# Patient Record
Sex: Female | Born: 1975 | Race: White | Hispanic: No | Marital: Married | Smoking: Current some day smoker
Health system: Southern US, Community
[De-identification: ages and names within clinical notes are randomized; demographics above are authoritative.]

## PROBLEM LIST (undated history)

## (undated) DIAGNOSIS — Z933 Colostomy status: Secondary | ICD-10-CM

## (undated) DIAGNOSIS — F419 Anxiety disorder, unspecified: Secondary | ICD-10-CM

## (undated) DIAGNOSIS — F32A Depression, unspecified: Secondary | ICD-10-CM

## (undated) DIAGNOSIS — K5792 Diverticulitis of intestine, part unspecified, without perforation or abscess without bleeding: Secondary | ICD-10-CM

## (undated) DIAGNOSIS — F329 Major depressive disorder, single episode, unspecified: Secondary | ICD-10-CM

## (undated) DIAGNOSIS — M549 Dorsalgia, unspecified: Secondary | ICD-10-CM

## (undated) DIAGNOSIS — I10 Essential (primary) hypertension: Secondary | ICD-10-CM

## (undated) HISTORY — DX: Depression, unspecified: F32.A

## (undated) HISTORY — DX: Dorsalgia, unspecified: M54.9

## (undated) HISTORY — PX: BOWEL RESECTION: SHX1257

## (undated) HISTORY — DX: Anxiety disorder, unspecified: F41.9

## (undated) HISTORY — DX: Essential (primary) hypertension: I10

## (undated) HISTORY — DX: Major depressive disorder, single episode, unspecified: F32.9

## (undated) HISTORY — PX: OTHER SURGICAL HISTORY: SHX169

---

## 2000-10-15 ENCOUNTER — Other Ambulatory Visit: Admission: RE | Admit: 2000-10-15 | Discharge: 2000-10-15 | Payer: Self-pay | Admitting: *Deleted

## 2001-10-27 ENCOUNTER — Other Ambulatory Visit: Admission: RE | Admit: 2001-10-27 | Discharge: 2001-10-27 | Payer: Self-pay | Admitting: *Deleted

## 2003-12-06 ENCOUNTER — Ambulatory Visit (HOSPITAL_COMMUNITY): Admission: RE | Admit: 2003-12-06 | Discharge: 2003-12-06 | Payer: Self-pay | Admitting: Obstetrics & Gynecology

## 2006-01-06 ENCOUNTER — Ambulatory Visit (HOSPITAL_COMMUNITY): Admission: RE | Admit: 2006-01-06 | Discharge: 2006-01-06 | Payer: Self-pay | Admitting: Obstetrics and Gynecology

## 2007-05-20 ENCOUNTER — Other Ambulatory Visit: Admission: RE | Admit: 2007-05-20 | Discharge: 2007-05-20 | Payer: Self-pay | Admitting: Obstetrics and Gynecology

## 2008-06-01 ENCOUNTER — Other Ambulatory Visit: Admission: RE | Admit: 2008-06-01 | Discharge: 2008-06-01 | Payer: Self-pay | Admitting: Obstetrics & Gynecology

## 2008-07-17 ENCOUNTER — Inpatient Hospital Stay (HOSPITAL_COMMUNITY): Admission: AD | Admit: 2008-07-17 | Discharge: 2008-07-17 | Payer: Self-pay | Admitting: Obstetrics & Gynecology

## 2009-11-14 ENCOUNTER — Ambulatory Visit: Payer: Self-pay | Admitting: Gastroenterology

## 2009-11-14 DIAGNOSIS — R109 Unspecified abdominal pain: Secondary | ICD-10-CM | POA: Insufficient documentation

## 2009-11-14 DIAGNOSIS — R197 Diarrhea, unspecified: Secondary | ICD-10-CM | POA: Insufficient documentation

## 2009-11-15 ENCOUNTER — Telehealth: Payer: Self-pay | Admitting: Gastroenterology

## 2009-11-19 LAB — CONVERTED CEMR LAB
ALT: 11 units/L (ref 0–35)
AST: 13 units/L (ref 0–37)
Albumin: 4.3 g/dL (ref 3.5–5.2)
Alkaline Phosphatase: 51 units/L (ref 39–117)
BUN: 14 mg/dL (ref 6–23)
Basophils Absolute: 0.1 10*3/uL (ref 0.0–0.1)
Basophils Relative: 1 % (ref 0–1)
Beta hcg, urine, semiquantitative: NEGATIVE
CA 125: 14.8 units/mL (ref 0.0–30.2)
CO2: 24 meq/L (ref 19–32)
Calcium: 9.4 mg/dL (ref 8.4–10.5)
Chloride: 107 meq/L (ref 96–112)
Creatinine, Ser: 0.85 mg/dL (ref 0.40–1.20)
Eosinophils Absolute: 0.1 10*3/uL (ref 0.0–0.7)
Eosinophils Relative: 1 % (ref 0–5)
Glucose, Bld: 89 mg/dL (ref 70–99)
HCT: 43.2 % (ref 36.0–46.0)
Hemoglobin: 14.5 g/dL (ref 12.0–15.0)
Lymphocytes Relative: 25 % (ref 12–46)
Lymphs Abs: 2.2 10*3/uL (ref 0.7–4.0)
MCHC: 33.6 g/dL (ref 30.0–36.0)
MCV: 91.3 fL (ref 78.0–100.0)
Monocytes Absolute: 0.7 10*3/uL (ref 0.1–1.0)
Monocytes Relative: 8 % (ref 3–12)
Neutro Abs: 5.7 10*3/uL (ref 1.7–7.7)
Neutrophils Relative %: 66 % (ref 43–77)
Platelets: 290 10*3/uL (ref 150–400)
Potassium: 4.4 meq/L (ref 3.5–5.3)
RBC: 4.73 M/uL (ref 3.87–5.11)
RDW: 13 % (ref 11.5–15.5)
Sodium: 138 meq/L (ref 135–145)
TSH: 1.388 microintl units/mL (ref 0.350–4.500)
Tissue Transglutaminase Ab, IgA: 18.8 units (ref <20)
Total Bilirubin: 0.5 mg/dL (ref 0.3–1.2)
Total Protein: 7.3 g/dL (ref 6.0–8.3)
WBC: 8.8 10*3/uL (ref 4.0–10.5)

## 2009-12-19 ENCOUNTER — Encounter (INDEPENDENT_AMBULATORY_CARE_PROVIDER_SITE_OTHER): Payer: Self-pay | Admitting: *Deleted

## 2010-01-03 ENCOUNTER — Encounter (INDEPENDENT_AMBULATORY_CARE_PROVIDER_SITE_OTHER): Payer: Self-pay | Admitting: *Deleted

## 2010-03-24 ENCOUNTER — Encounter: Payer: Self-pay | Admitting: Gastroenterology

## 2010-04-02 NOTE — Letter (Signed)
Summary: CT SCAN ORDER  CT SCAN ORDER   Imported By: Ave Filter 11/14/2009 11:54:44  _____________________________________________________________________  External Attachment:    Type:   Image     Comment:   External Document

## 2010-04-02 NOTE — Assessment & Plan Note (Signed)
Summary: ABD PAIN, DIARRHEA   Visit Type:  Initial Consult Referring Provider:  Chilton Si, NP-c Primary Care Provider:  Juanetta Gosling, M.D.  Chief Complaint:  abdominal pain/diarrhea.  History of Present Illness: Has pain and diarrhea. Not always related. Sx for 6 mos: same. AChy from ribs to pelvis. Comes and goes. Sx lasts hours, FRI all night and couldn't sleep. Happens 1-2x/month. No precipitating factors. GB in. Not related to foods. Eats a fast lunch when working. Stress: family. No blood in stool, nausea, problems swallowing, or constipation or melena. Rare heartburn/indigestion associate with eating the wrong thing. Rare vomiting with diarrhea. Worse with jarring abdomen. No ASA, BC, Goody's, or ALeve.  800 mg Iburofen two times a day ever month for two doses for menstrual cramps. No travel, Abx, and has well water. No sick contacts. Bms: 2, nl-fmd. Never been diagnosed with IBS. Gained 25 lbs since got married 1.5 yr ago.  Preventive Screening-Counseling & Management  Alcohol-Tobacco     Smoking Status: current      Drug Use:  no.    Current Medications (verified): 1)  No Meds  Allergies (verified): No Known Drug Allergies  Past History:  Past Medical History: None  Past Surgical History: None  Family History: No FH of Colon Cancer or polyps or celiac sprue brother has complicated diverticulitis/partial colectomy. Mother has IBS.  No Family History of Ovarian Cancer: No Family History of Pancreatic Cancer: No Family History of Stomach Cancer: No Family History of Uterine Cancer:  Social History: Occupation: Charity fundraiser, L&D Patient currently smokes. 1 pk/day Married, no kids Alcohol Use - yes: 3x/week Illicit Drug Use - no Smoking Status:  current Drug Use:  no  Review of Systems       No CP, hemoptysis, or SOB. Smoker's cough  Abd u/s 2007: GB WALL 2.8 MM thickened  May 2010: nl hfp, cbc, neg urine hcg  PER HPI OTHERWISE ALL SYSTEMS NEGATIVE.  Vital  Signs:  Patient profile:   35 year old female Height:      64.5 inches Weight:      173 pounds BMI:     29.34 Temp:     99.5 degrees F oral Pulse rate:   72 / minute BP sitting:   104 / 78  (left arm) Cuff size:   regular  Vitals Entered By: Cloria Spring LPN (November 14, 2009 10:54 AM)  Physical Exam  General:  Well developed, well nourished, no acute distress. Head:  Normocephalic and atraumatic. Eyes:  PERRL, no icterus. Mouth:  No deformity or lesions. Neck:  Supple; no masses or thyromegaly, nt THYROID. Lungs:  Clear throughout to auscultation. Heart:  Regular rate and rhythm; no murmurs. Abdomen:  Soft, nontender and nondistended. No masses, or hernias noted. Normal bowel sounds. Extremities:  No edema or deformities noted. Neurologic:  Alert and  oriented x4;  grossly normal neurologically. Skin:  NO PRETIBIAL LESIONS.  Impression & Recommendations:  Problem # 1:  DIARRHEA (ICD-787.91) Differential diagnosis includes IBS-d, lactose intolerance, celiac sprue, inflammatory bowel disease, CDIFF colitis,  bile-salt induced diarrhea due to biliary dyskinesia, or microscopic colitis. Submit stool studies WHEN YOU HAVE DIARRHEA. Low dairy diet for 2 weeks and see if pain or diarrhea still occurs. Follow a LOW FAT DIET BEGINNING IN 2 WEEKS. Use Levsin as needed for abd cramps and pain. Levsin may cause dry EYES/MOUTH, DROWSINESS, BLURRY VISION OR URINARY RETENTION. Check labs today.FOLLOW UP IN 2 MOS. If Sx not improved or she has evidnece for anemia  then she will need a TCS.    Orders: T-CBC w/Diff [id number]) T-CA [phone]) T-Stool Giardia / Crypto- EIA ([id number]) T-Fecal WBC ([id number]) T-Culture, C-Diff Toxin A/B ([id number]) T-Culture, C-Diff Toxin A/B ([id number]) T-Culture, C-Diff Toxin A/B ([id number])  Problem # 2:  ABDOMINAL PAIN (ICD-789.00) Abnl GB wall in 2007 and no subsequent imaging. Differential diagnosis includes IBS-d, lactose intolerance,  celiac sprue, inflammatory bowel disease, CDIFF colitis,  bile-salt induced diarrhea due to biliary dyskinesia, pregnancy, and less likely ovarian CA or GB cancer. Will order a CT SCAN of ABD/PELVIS. Labs to check for ovarian CA AND PREGNANCY    CC: PCP  Orders: T-CBC w/Diff ([id number]) T-CA 125 [phone]) T-Stool Giardia / Crypto- EIA (10932) T-Fecal WBC ([id number]) T-Culture, C-Diff Toxin A/B ([id number]) T-Culture, C-Diff Toxin A/B ([id number]) T-Culture, C-Diff Toxin A/B ([id number])  Other Orders: T-TSH [id number]) T-Tissue Transglutamase Ab IgA ([id number]) T-Comprehensive Metabolic Panel ([id number])  Patient Instructions: 1)  Will order a CT SCAN of ABD/PELVIS. 2)  Labs to check for ovarian CA, thyroid problems or allergy to wheat. 3)  Submit stool studies WHEN YOU HAVE DIARRHEA. 4)  Low dairy diet for 2 weeks and see if pain or diarrhea still occurs. 5)  Follow a LOW FAT DIET BEGINNING IN 2 WEEKS. 6)  Use Levsin as needed for abd cramps and pain. 7)  Levsin may cause dry EYES/MOUTH, DROWSINESS, BLURRY VISION OR URINARY RETENTION. 8)  FOLLOW UP IN 2 MOS.  9)  The medication list was reviewed and reconciled.  All changed / newly prescribed medications were explained.  A complete medication list was provided to the patient / caregiver. Prescriptions: LEVSIN 0.125 MG TABS (HYOSCYAMINE SULFATE) 1-2 sl 30 minutes before meals or with onset of abd pain, may repeat every 4 hours. No more than 8/day  #40 x 5   Entered and Authorized by:   West Bali MD   Signed by:   West Bali MD on 11/14/2009   Method used:   Electronically to        [facility] (retail)       [address]       [place], [place]  [address]       Ph: [phone] or [phone]       Fax: [phone]   RxID:   [id number]   Appended Document: ABD PAIN, DIARRHEA 2 MONTH F/U OPV IS IN THE COMPUTER  Appended Document: Orders Update    Clinical  Lists Changes  Orders: Added new Service order of Consultation Level IV [id number]) - Signed

## 2010-04-02 NOTE — Miscellaneous (Signed)
Summary: Orders Update  Clinical Lists Changes  Problems: Added new problem of DIARRHEA (ICD-787.91) Added new problem of ABDOMINAL PAIN (ICD-789.00) Orders: Added new Test order of T-Pregnancy Test, Urine, Qual (24235) - Signed

## 2010-04-02 NOTE — Progress Notes (Signed)
Summary: NL TSH, CMP, CBC, CA125, HCG  Please call pt. She is not pregnant and her marker for ovarian CA is negative. Her CBC, CMP, and TSH are normal. I am still waiting on the results to determine if she has celiac sprue. West Bali MD  November 15, 2009 10:12 AM]  FAX LABS TO PCP. West Bali MD  November 15, 2009 10:13 AM   Appended Document: NL TSH, CMP, CBC, CA125, HCG tried to call pt- NA  Appended Document: NL TSH, CMP, CBC, CA125, HCG Please call pt. She does not have an allergy to wheat. Her blood test was negative for celiac sprue.  Appended Document: NL TSH, CMP, CBC, CA125, HCG Pt was informed of all of the above

## 2010-04-02 NOTE — Letter (Signed)
Summary: Recall Office Visit  [facility]  [address]   [place], [place] [address]   Phone: [phone]  Fax: [phone]      January 03, 2010   QIARA MINETTI [address] [place], [place]  [address] 18-Dec-1975   Dear [name],   According to our records, it is time for you to schedule a follow-up office visit with [place].   At your convenience, please call [phone] to schedule an office visit. If you have any questions, concerns, or feel that this letter is in error, we would appreciate your call.   Sincerely,    Diana Eves  [facility] Ph: [phone]   Fax: [phone]

## 2010-04-02 NOTE — Letter (Signed)
Summary: Radiology Test Reminder  [facility]  [address]   [place], [place] [address]   Phone: [phone]  Fax: [phone]     December 19, 2009   Cheryl Ballard [address] [place], [place]  [address] 1975/10/07  Dear [name],  During your last appointment, your doctor requested you have a CT Scan.  Our records indicate you have not had this done.  Remember it is very important to follow your doctor's instructions.  Please have this done as soon as possible.  If you have questions regarding this appointment, please call our office and we can reschedule this for you.  It is important that patients and their doctor work together in the management and treatment of their health care.  If you have already had your test done, please disregard this letter.  Thank you,    Ave Filter  [facility] Ph: [phone]   Fax: [phone]

## 2010-04-03 HISTORY — PX: COLON SURGERY: SHX602

## 2010-04-06 ENCOUNTER — Encounter (HOSPITAL_COMMUNITY): Payer: Self-pay

## 2010-04-06 ENCOUNTER — Inpatient Hospital Stay (HOSPITAL_COMMUNITY)
Admission: EM | Admit: 2010-04-06 | Discharge: 2010-04-10 | DRG: 329 | Disposition: A | Discharge status: Home Health | Payer: 59 | Attending: General Surgery | Admitting: General Surgery

## 2010-04-06 ENCOUNTER — Other Ambulatory Visit: Payer: Self-pay | Admitting: General Surgery

## 2010-04-06 ENCOUNTER — Emergency Department (HOSPITAL_COMMUNITY): Admit: 2010-04-06 | Discharge: 2010-04-06 | Disposition: A | Discharge status: Home / Self Care | Payer: 59

## 2010-04-06 DIAGNOSIS — N83209 Unspecified ovarian cyst, unspecified side: Secondary | ICD-10-CM | POA: Diagnosis present

## 2010-04-06 DIAGNOSIS — N735 Female pelvic peritonitis, unspecified: Secondary | ICD-10-CM | POA: Diagnosis present

## 2010-04-06 DIAGNOSIS — K5732 Diverticulitis of large intestine without perforation or abscess without bleeding: Principal | ICD-10-CM | POA: Diagnosis present

## 2010-04-06 DIAGNOSIS — B951 Streptococcus, group B, as the cause of diseases classified elsewhere: Secondary | ICD-10-CM | POA: Diagnosis present

## 2010-04-06 LAB — CBC
HCT: 38.5 % (ref 36.0–46.0)
Hemoglobin: 13.4 g/dL (ref 12.0–15.0)
MCH: 31.2 pg (ref 26.0–34.0)
MCHC: 34.8 g/dL (ref 30.0–36.0)
MCV: 89.5 fL (ref 78.0–100.0)
Platelets: 184 10*3/uL (ref 150–400)
RBC: 4.3 MIL/uL (ref 3.87–5.11)
RDW: 13.5 % (ref 11.5–15.5)
WBC: 15.8 10*3/uL — ABNORMAL HIGH (ref 4.0–10.5)

## 2010-04-06 LAB — BASIC METABOLIC PANEL
BUN: 10 mg/dL (ref 6–23)
CO2: 24 mEq/L (ref 19–32)
Calcium: 8.8 mg/dL (ref 8.4–10.5)
Chloride: 103 mEq/L (ref 96–112)
Creatinine, Ser: 0.88 mg/dL (ref 0.4–1.2)
GFR calc Af Amer: >60 mL/min (ref >60)
GFR calc non Af Amer: >60 mL/min (ref >60)
Glucose, Bld: 124 mg/dL — ABNORMAL HIGH (ref 70–99)
Potassium: 3.4 mEq/L — ABNORMAL LOW (ref 3.5–5.1)
Sodium: 134 mEq/L — ABNORMAL LOW (ref 135–145)

## 2010-04-06 LAB — URINALYSIS, ROUTINE W REFLEX MICROSCOPIC
Bilirubin Urine: NEGATIVE
Hgb urine dipstick: NEGATIVE
Ketones, ur: NEGATIVE mg/dL
Nitrite: NEGATIVE
Protein, ur: NEGATIVE mg/dL
Specific Gravity, Urine: 1.025 (ref 1.005–1.030)
Urine Glucose, Fasting: NEGATIVE mg/dL
Urobilinogen, UA: 0.2 mg/dL (ref 0.0–1.0)
pH: 6 (ref 5.0–8.0)

## 2010-04-06 LAB — DIFFERENTIAL
Basophils Absolute: 0 10*3/uL (ref 0.0–0.1)
Basophils Relative: 0 % (ref 0–1)
Eosinophils Absolute: 0 10*3/uL (ref 0.0–0.7)
Eosinophils Relative: 0 % (ref 0–5)
Lymphocytes Relative: 10 % — ABNORMAL LOW (ref 12–46)
Lymphs Abs: 1.6 10*3/uL (ref 0.7–4.0)
Monocytes Absolute: 0.7 10*3/uL (ref 0.1–1.0)
Monocytes Relative: 4 % (ref 3–12)
Neutro Abs: 13.5 10*3/uL — ABNORMAL HIGH (ref 1.7–7.7)
Neutrophils Relative %: 85 % — ABNORMAL HIGH (ref 43–77)

## 2010-04-06 LAB — WET PREP, GENITAL
Trich, Wet Prep: NONE SEEN
Yeast Wet Prep HPF POC: NONE SEEN

## 2010-04-06 LAB — PREGNANCY, URINE: Preg Test, Ur: NEGATIVE

## 2010-04-06 MED ORDER — IOHEXOL 300 MG/ML  SOLN
100.0000 mL | Freq: Once | INTRAMUSCULAR | Status: AC | PRN
  Administered 2010-04-06: 100 mL via INTRAVENOUS

## 2010-04-07 LAB — BASIC METABOLIC PANEL
BUN: 6 mg/dL (ref 6–23)
CO2: 25 mEq/L (ref 19–32)
Calcium: 8.1 mg/dL — ABNORMAL LOW (ref 8.4–10.5)
Chloride: 105 mEq/L (ref 96–112)
Creatinine, Ser: 0.8 mg/dL (ref 0.4–1.2)
GFR calc Af Amer: >60 mL/min (ref >60)
GFR calc non Af Amer: >60 mL/min (ref >60)
Glucose, Bld: 122 mg/dL — ABNORMAL HIGH (ref 70–99)
Potassium: 3.4 mEq/L — ABNORMAL LOW (ref 3.5–5.1)
Sodium: 135 mEq/L (ref 135–145)

## 2010-04-07 LAB — MAGNESIUM: Magnesium: 1.8 mg/dL (ref 1.5–2.5)

## 2010-04-07 LAB — CBC
HCT: 31.6 % — ABNORMAL LOW (ref 36.0–46.0)
Hemoglobin: 11.1 g/dL — ABNORMAL LOW (ref 12.0–15.0)
MCH: 31.6 pg (ref 26.0–34.0)
MCHC: 35.1 g/dL (ref 30.0–36.0)
MCV: 90 fL (ref 78.0–100.0)
Platelets: 169 10*3/uL (ref 150–400)
RBC: 3.51 MIL/uL — ABNORMAL LOW (ref 3.87–5.11)
RDW: 13.7 % (ref 11.5–15.5)
WBC: 15.7 10*3/uL — ABNORMAL HIGH (ref 4.0–10.5)

## 2010-04-07 LAB — DIFFERENTIAL
Basophils Absolute: 0 10*3/uL (ref 0.0–0.1)
Basophils Relative: 0 % (ref 0–1)
Eosinophils Absolute: 0 10*3/uL (ref 0.0–0.7)
Eosinophils Relative: 0 % (ref 0–5)
Lymphocytes Relative: 10 % — ABNORMAL LOW (ref 12–46)
Lymphs Abs: 1.5 10*3/uL (ref 0.7–4.0)
Monocytes Absolute: 1.1 10*3/uL — ABNORMAL HIGH (ref 0.1–1.0)
Monocytes Relative: 7 % (ref 3–12)
Neutro Abs: 13 10*3/uL — ABNORMAL HIGH (ref 1.7–7.7)
Neutrophils Relative %: 83 % — ABNORMAL HIGH (ref 43–77)

## 2010-04-07 LAB — ALBUMIN: Albumin: 2.7 g/dL — ABNORMAL LOW (ref 3.5–5.2)

## 2010-04-07 LAB — PHOSPHORUS: Phosphorus: 2.2 mg/dL — ABNORMAL LOW (ref 2.3–4.6)

## 2010-04-08 LAB — DIFFERENTIAL
Basophils Absolute: 0 10*3/uL (ref 0.0–0.1)
Basophils Relative: 0 % (ref 0–1)
Eosinophils Absolute: 0.2 10*3/uL (ref 0.0–0.7)
Eosinophils Relative: 1 % (ref 0–5)
Lymphocytes Relative: 14 % (ref 12–46)
Lymphs Abs: 1.6 10*3/uL (ref 0.7–4.0)
Monocytes Absolute: 1.1 10*3/uL — ABNORMAL HIGH (ref 0.1–1.0)
Monocytes Relative: 9 % (ref 3–12)
Neutro Abs: 8.9 10*3/uL — ABNORMAL HIGH (ref 1.7–7.7)
Neutrophils Relative %: 76 % (ref 43–77)

## 2010-04-08 LAB — MAGNESIUM: Magnesium: 1.8 mg/dL (ref 1.5–2.5)

## 2010-04-08 LAB — CBC
HCT: 34.1 % — ABNORMAL LOW (ref 36.0–46.0)
Hemoglobin: 11.8 g/dL — ABNORMAL LOW (ref 12.0–15.0)
MCH: 31.1 pg (ref 26.0–34.0)
MCHC: 34.6 g/dL (ref 30.0–36.0)
MCV: 90 fL (ref 78.0–100.0)
Platelets: 197 10*3/uL (ref 150–400)
RBC: 3.79 MIL/uL — ABNORMAL LOW (ref 3.87–5.11)
RDW: 13.7 % (ref 11.5–15.5)
WBC: 11.7 10*3/uL — ABNORMAL HIGH (ref 4.0–10.5)

## 2010-04-08 LAB — BASIC METABOLIC PANEL
BUN: 7 mg/dL (ref 6–23)
CO2: 27 mEq/L (ref 19–32)
Calcium: 8.3 mg/dL — ABNORMAL LOW (ref 8.4–10.5)
Chloride: 107 mEq/L (ref 96–112)
Creatinine, Ser: 0.85 mg/dL (ref 0.4–1.2)
GFR calc Af Amer: >60 mL/min (ref >60)
GFR calc non Af Amer: >60 mL/min (ref >60)
Glucose, Bld: 105 mg/dL — ABNORMAL HIGH (ref 70–99)
Potassium: 3.7 mEq/L (ref 3.5–5.1)
Sodium: 141 mEq/L (ref 135–145)

## 2010-04-08 LAB — GC/CHLAMYDIA PROBE AMP, GENITAL
Chlamydia, DNA Probe: NEGATIVE
GC Probe Amp, Genital: NEGATIVE

## 2010-04-08 LAB — ALBUMIN: Albumin: 2.5 g/dL — ABNORMAL LOW (ref 3.5–5.2)

## 2010-04-09 LAB — CBC
Hemoglobin: 11.1 g/dL — ABNORMAL LOW (ref 12.0–15.0)
MCH: 31.5 pg (ref 26.0–34.0)
MCHC: 35.2 g/dL (ref 30.0–36.0)
MCV: 89.5 fL (ref 78.0–100.0)
RBC: 3.52 MIL/uL — ABNORMAL LOW (ref 3.87–5.11)

## 2010-04-09 LAB — WOUND CULTURE

## 2010-04-09 LAB — DIFFERENTIAL
Basophils Relative: 0 % (ref 0–1)
Eosinophils Absolute: 0.4 10*3/uL (ref 0.0–0.7)
Lymphs Abs: 1.9 10*3/uL (ref 0.7–4.0)
Monocytes Absolute: 1 10*3/uL (ref 0.1–1.0)
Monocytes Relative: 12 % (ref 3–12)
Neutro Abs: 5 10*3/uL (ref 1.7–7.7)
Neutrophils Relative %: 61 % (ref 43–77)

## 2010-04-09 LAB — BASIC METABOLIC PANEL
BUN: 6 mg/dL (ref 6–23)
CO2: 30 mEq/L (ref 19–32)
Calcium: 8.4 mg/dL (ref 8.4–10.5)
GFR calc non Af Amer: >60 mL/min (ref >60)
Glucose, Bld: 82 mg/dL (ref 70–99)
Sodium: 138 mEq/L (ref 135–145)

## 2010-04-11 LAB — ANAEROBIC CULTURE

## 2010-04-14 NOTE — Discharge Summary (Signed)
  NAME:  RAILYN, HOUSE NO.:  [id number]  MEDICAL RECORD NO.:  [id number]           PATIENT TYPE:  I  LOCATION:  A332                          FACILITY:  [facility]  PHYSICIAN:  Dalia Heading, M.D.  DATE OF BIRTH:  06/23/1975  DATE OF ADMISSION:  04/06/2010 DATE OF DISCHARGE:  02/08/2012LH                              DISCHARGE SUMMARY   HOSPITAL COURSE SUMMARY:  The patient is a 35 year old white female who presented to the emergency room with worsening abdominal pain.  CT scan of the abdomen and pelvis revealed a perforated viscus.  A surgery consultation was obtained and the patient was taken urgently to the operating room and underwent an Hartmann procedure.  She was found to have a distal sigmoid colon diverticular perforation.  She tolerated the procedure well.  Her postoperative course was for the most part unremarkable.  Her diet was advanced without difficulty.  The stoma therapy nurse did instruct the patient on care for colostomy.  Cultures from the pelvis did reveal a strep group B bacteria infection.  The patient is being discharged home in good and stable condition on April 10, 2010.  DISCHARGE MEDICATIONS: 1. Augmentin 875 mg 1 tablet p.o. b.i.d. x10 days. 2. Vicodin 1-2 tablets p.o. q.4 h. p.r.n. pain. 3. Phenergan 25 mg p.o. q.6 h. p.r.n. nausea.  PRINCIPAL DIAGNOSIS:  Perforated sigmoid diverticulitis with peritonitis.  PRINCIPAL PROCEDURE:  Gertie Gowda procedure on April 06, 2010.     Dalia Heading, M.D.     MAJ/MEDQ  D:  04/10/2010  T:  04/11/2010  Job:  [id number]  cc:   Lazaro Arms, M.D. Fax: [phone]  Electronically Signed by Franky Macho M.D. on 04/12/2010 01:24:43 PM

## 2010-04-16 NOTE — Op Note (Signed)
NAME:  Cheryl Ballard, STAHLE NO.:  [id number]  MEDICAL RECORD NO.:  [id number]           PATIENT TYPE:  I  LOCATION:  A332                          FACILITY:  [facility]  PHYSICIAN:  Dalia Heading, M.D.  DATE OF BIRTH:  1975/04/28  DATE OF PROCEDURE:  04/06/2010 DATE OF DISCHARGE:                              OPERATIVE REPORT   PREOPERATIVE DIAGNOSIS:  Perforated viscus.  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  PROCEDURE:  Hartmann procedure.  SURGEON:  Dalia Heading, MD  ANESTHESIA:  General endotracheal.  INDICATIONS:  The patient is a 35 year old white female presented with worsening lower pelvic pain.  She was noted to have a leukocytosis in the emergency room.  A CT scan of the abdomen and pelvis revealed pneumoperitoneum with pelvic abscess, inflamed sigmoid colon, and fluid in the uterus and vagina.  The patient now comes to the operating for an exploratory laparotomy with probable temporary colostomy placement.  The risks and benefits of procedure including bleeding and infection were fully explained to the patient, gave informed consent.  PROCEDURE NOTE:  The patient was placed in supine position.  After induction of general endotracheal anesthesia, the abdomen was prepped and draped using the usual sterile technique with DuraPrep.  Surgical site confirmation was performed.  A lower midline incision was made.  The peritoneal cavity was then entered into without difficulty.  Purulent fluid was noted within the pelvis.  Aerobic and anaerobic cultures were taken and sent to Microbiology.  The fluid was evacuated.  The sigmoid colon was mobilized along its peritoneal reflection down to the distal end to the pelvis. Care was taken to avoid the left ureter.  From the mesenteric border of the colorectal juncture to the uterus just proximal to the cervix, there was a connection present.  This was  bluntly freed away and the inflamed short segment sigmoid colon was mobilized.  A TA stapler was placed across the colorectal juncture and fired.  A small section of the distal sigmoid colon mesentery was divided using the LigaSure.  A GIA stapler was then placed across the midsigmoid colon and fired.  The specimen was then removed and sent to Pathology for further examination.  The pelvis was then copiously irrigated with normal saline and gentamicin.  A simple cyst was noted on the left ovary and this was aspirated.  The left ovary did appear somewhat enlarged.  The right ovary was also noted to be enlarged.  Obviously, there was inflammation of the ovaries, fallopian tube, and uterus.  A #10 flat Jackson-Pratt drain was placed in this region and brought through a separate stab wound to the right at the midline.  It was secured at the skin level using a 3-0 nylon suture. A colostomy was then formed in the left side of the abdomen at the level of the umbilicus.  All operating room personnel then changed their gloves.  The peritoneum was closed using an O chromic gut running suture.  The fascia was reapproximated using an O PDS running suture.  The subcutaneous layer was irrigated with normal saline and  the skin was closed using staples.  The colostomy was then matured using 3-0 chromic gut interrupted sutures.  An ostomy bag was then applied.  Betadine ointment and dry sterile dressing were applied to the lower midline wound.  All tape and needle counts were correct at the end of the procedure. The patient was extubated in the operating room and went back to recovery room awake in stable condition.  COMPLICATIONS:  None.  SPECIMEN:  Sigmoid colon, opened and distal.  BLOOD LOSS:  100 mL.  CULTURES:  Aerobic and anaerobic cultures for microbiology.  DRAINS:  Jackson-Pratt drain to pelvis.     Dalia Heading, M.D.     MAJ/MEDQ  D:  04/06/2010  T:  04/07/2010  Job:   [id number]  cc:   Lazaro Arms, M.D. Fax: [phone]  Electronically Signed by Franky Macho M.D. on 04/09/2010 09:29:57 AM

## 2010-06-05 ENCOUNTER — Other Ambulatory Visit: Payer: Self-pay | Admitting: General Surgery

## 2010-06-05 ENCOUNTER — Encounter (HOSPITAL_COMMUNITY): Payer: 59

## 2010-06-05 LAB — BASIC METABOLIC PANEL
BUN: 12 mg/dL (ref 6–23)
CO2: 21 mEq/L (ref 19–32)
Calcium: 9.4 mg/dL (ref 8.4–10.5)
Chloride: 110 mEq/L (ref 96–112)
Creatinine, Ser: 0.96 mg/dL (ref 0.4–1.2)

## 2010-06-05 LAB — CBC
MCH: 29.9 pg (ref 26.0–34.0)
MCHC: 33.5 g/dL (ref 30.0–36.0)
MCV: 89.2 fL (ref 78.0–100.0)
Platelets: 243 10*3/uL (ref 150–400)
RDW: 13.2 % (ref 11.5–15.5)

## 2010-06-05 LAB — HCG, QUANTITATIVE, PREGNANCY: hCG, Beta Chain, Quant, S: <2 m[IU]/mL (ref <5)

## 2010-06-08 LAB — TYPE AND SCREEN: ABO/RH(D): O POS

## 2010-06-11 LAB — URINALYSIS, ROUTINE W REFLEX MICROSCOPIC
Glucose, UA: NEGATIVE mg/dL
Protein, ur: NEGATIVE mg/dL
Specific Gravity, Urine: 1.025 (ref 1.005–1.030)
pH: 6 (ref 5.0–8.0)

## 2010-06-11 LAB — COMPREHENSIVE METABOLIC PANEL
AST: 19 U/L (ref 0–37)
Albumin: 4.1 g/dL (ref 3.5–5.2)
Calcium: 9.6 mg/dL (ref 8.4–10.5)
Chloride: 106 mEq/L (ref 96–112)
Creatinine, Ser: 0.78 mg/dL (ref 0.4–1.2)
GFR calc Af Amer: >60 mL/min (ref >60)
Sodium: 138 mEq/L (ref 135–145)
Total Bilirubin: 0.5 mg/dL (ref 0.3–1.2)

## 2010-06-11 LAB — CBC
MCV: 92 fL (ref 78.0–100.0)
Platelets: 248 10*3/uL (ref 150–400)
WBC: 13.9 10*3/uL — ABNORMAL HIGH (ref 4.0–10.5)

## 2010-06-11 LAB — DIFFERENTIAL
Eosinophils Relative: 0 % (ref 0–5)
Lymphocytes Relative: 10 % — ABNORMAL LOW (ref 12–46)
Lymphs Abs: 1.4 10*3/uL (ref 0.7–4.0)
Monocytes Absolute: 0.7 10*3/uL (ref 0.1–1.0)

## 2010-06-11 LAB — URINE MICROSCOPIC-ADD ON

## 2010-06-12 ENCOUNTER — Inpatient Hospital Stay (HOSPITAL_COMMUNITY)
Admission: RE | Admit: 2010-06-12 | Discharge: 2010-06-15 | DRG: 743 | Disposition: A | Discharge status: Home / Self Care | Payer: 59 | Source: Ambulatory Visit | Attending: General Surgery | Admitting: General Surgery

## 2010-06-12 DIAGNOSIS — Z433 Encounter for attention to colostomy: Secondary | ICD-10-CM

## 2010-06-12 DIAGNOSIS — N736 Female pelvic peritoneal adhesions (postinfective): Principal | ICD-10-CM | POA: Diagnosis present

## 2010-06-12 DIAGNOSIS — E876 Hypokalemia: Secondary | ICD-10-CM | POA: Diagnosis present

## 2010-06-13 LAB — CBC
MCH: 30.1 pg (ref 26.0–34.0)
MCV: 88.6 fL (ref 78.0–100.0)
Platelets: 193 10*3/uL (ref 150–400)
RDW: 12.7 % (ref 11.5–15.5)

## 2010-06-13 LAB — DIFFERENTIAL
Eosinophils Absolute: 0 10*3/uL (ref 0.0–0.7)
Eosinophils Relative: 0 % (ref 0–5)
Lymphs Abs: 3 10*3/uL (ref 0.7–4.0)
Monocytes Relative: 11 % (ref 3–12)

## 2010-06-13 LAB — BASIC METABOLIC PANEL
BUN: 5 mg/dL — ABNORMAL LOW (ref 6–23)
Calcium: 8.3 mg/dL — ABNORMAL LOW (ref 8.4–10.5)
GFR calc non Af Amer: >60 mL/min (ref >60)
Glucose, Bld: 97 mg/dL (ref 70–99)
Potassium: 3.2 mEq/L — ABNORMAL LOW (ref 3.5–5.1)

## 2010-06-13 NOTE — Op Note (Signed)
NAME:  Cheryl Ballard, Cheryl Ballard NO.:  [id number]  MEDICAL RECORD NO.:  [id number]           PATIENT TYPE:  I  LOCATION:  A318                          FACILITY:  [facility]  PHYSICIAN:  Dalia Heading, M.D.  DATE OF BIRTH:  05-29-1975  DATE OF PROCEDURE:  06/12/2010 DATE OF DISCHARGE:                              OPERATIVE REPORT   PREOPERATIVE DIAGNOSIS:  Status post Hartmann procedure.  POSTOPERATIVE DIAGNOSIS:  Status post Hartmann procedure.  PROCEDURES:  Colostomy reversal, flexible sigmoidoscopy.  SURGEON:  Dalia Heading, MD  ANESTHESIA:  General endotracheal.  INDICATIONS:  The patient is a 35 year old white female status post a Hartmann procedure earlier this year due to a perforated sigmoid colon diverticulum.  She now presents for colostomy reversal.  The risks and benefits of the procedure including bleeding, infection, and the possibly of a blood transfusion were fully explained to the patient, gave informed consent.  PROCEDURE NOTE:  The patient was placed in lithotomy position.  After induction of general endotracheal anesthesia, the abdomen was prepped and draped using the usual sterile technique with Betadine.  Surgical site confirmation was performed.  A flexible sigmoidoscopy was done in the rectal stump.  The rectal stump was approximately 20 cm in length.  There was no evidence of diverticular disease or stricture.  The bowel preparation was adequate.  A midline incision was made through previous lower midline incision. The peritoneal cavity was entered into without difficulty.  The pelvis was then exposed.  Dr. Despina Hidden of Gynecology then proceeded to do a pelvic exploration.  He will dictate his portion of the procedure.  Once he completed his exploration, the rectal stump was mobilized without difficulty.  An elliptical incision was then made around the colostomy within the left lower quadrant.  The ostomy was sealed using an 0 silk suture.   The colostomy was freed away from the fascia and inverted into the abdomen.  A GIA stapler was then placed across the ostomy and fired. The ostomy was disposed of.  Next, a side-to-side anastomosis was then performed using a GIA-55 stapler.  The enterotomy was closed using a TA- 60 stapler.  The staple line was bolstered using 3-0 silk sutures.  A wide open anastomosis was found.  There was no evidence of leakage.  A #10 flat Jackson-Pratt drain was placed into the pelvis due to the exploration in the pelvis and brought through a separate stab wound to the right of the midline.  It was secured at the skin level using a 3-0 nylon interrupted suture.  The pelvis and abdomen were then copiously irrigated with normal saline.  All surgical personnel then changed their gloves.  The ostomy site was closed, fascia was reapproximated intra-abdominally using 0 Vicryl interrupted sutures.  The midline fascia was closed using a running 0 PDS suture.  Subcutaneous layer was irrigated with normal saline and both incisions were closed using staples.  Betadine ointment, dry sterile dressings were applied.  All tape and needle counts were correct at the end of the procedure. The patient was extubated in the operating room, went back to recovery room awake in stable  condition.  COMPLICATIONS:  None.  SPECIMEN:  None.  BLOOD LOSS:  100 mL.  DRAINS:  Jackson-Pratt drain to pelvis.     Dalia Heading, M.D.     MAJ/MEDQ  D:  06/12/2010  T:  06/13/2010  Job:  [id number]  cc:   Lazaro Arms, M.D. Fax: [phone]  Electronically Signed by Franky Macho M.D. on 06/13/2010 07:54:12 AM

## 2010-06-13 NOTE — H&P (Signed)
  NAME:  NOMI, RUDNICKI NO.:  [id number]  MEDICAL RECORD NO.:  [id number]           PATIENT TYPE:  LOCATION:                                 FACILITY:  PHYSICIAN:  Dalia Heading, M.D.  DATE OF BIRTH:  07/11/1975  DATE OF ADMISSION: DATE OF DISCHARGE:  LH                             HISTORY & PHYSICAL   CHIEF COMPLAINT:  History of perforated diverticulitis, status post Hartmann procedure.  HISTORY OF PRESENT ILLNESS:  The patient is a 35 year old white female status post a Hartmann procedure for perforated sigmoid diverticulitis on April 06, 2010, who now presents for a colostomy reversal.  She has been doing well since her surgery and now presents for colostomy takedown.  PAST MEDICAL HISTORY:  As noted above.  PAST SURGICAL HISTORY:  As noted above.  MEDICATIONS:  None.  ALLERGIES:  No known drug allergies.  REVIEW OF SYSTEMS:  The patient denies drinking or smoking.  She denies any other cardiopulmonary difficulties or bleeding disorders.  FAMILY MEDICAL HISTORY:  History of diverticulosis in her brother.  PHYSICAL EXAMINATION:  The patient is a well-developed and well- nourished white female in no acute distress.  HEENT examination reveals no scleral icterus.  Lungs clear to auscultation with equal breath sounds bilaterally.  Heart examination reveals regular rate and rhythm without S3, S4, or murmurs.  The abdomen is soft, nontender, nondistended.  A well-healed surgical scar is known in lower midline.  A colostomy is present in the left side of the abdomen.  IMPRESSION:  History of perforated sigmoid diverticulitis, status post Hartmann procedure.  PLAN:  The patient is scheduled for a colostomy takedown, colonoscopy, and pelvic exploration by Dr. Despina Hidden of Gynecology on June 12, 2010. Risks and benefits of the procedures including bleeding, infection, and the possibility of a blood transfusion were fully explained to the patient, gave  informed consent.     Dalia Heading, M.D.     MAJ/MEDQ  D:  05/23/2010  T:  05/23/2010  Job:  [id number]  cc:   Chase Picket at [facility].  Lazaro Arms, M.D. Fax: [phone]  Electronically Signed by Franky Macho M.D. on 06/13/2010 07:54:10 AM

## 2010-06-14 LAB — DIFFERENTIAL
Basophils Relative: 0 % (ref 0–1)
Monocytes Absolute: 1 10*3/uL (ref 0.1–1.0)
Monocytes Relative: 9 % (ref 3–12)
Neutro Abs: 7 10*3/uL (ref 1.7–7.7)

## 2010-06-14 LAB — BASIC METABOLIC PANEL
CO2: 25 mEq/L (ref 19–32)
Calcium: 8.5 mg/dL (ref 8.4–10.5)
Creatinine, Ser: 0.72 mg/dL (ref 0.4–1.2)
GFR calc Af Amer: >60 mL/min (ref >60)

## 2010-06-14 LAB — CBC
MCHC: 33.8 g/dL (ref 30.0–36.0)
Platelets: 239 10*3/uL (ref 150–400)
RDW: 13.1 % (ref 11.5–15.5)

## 2010-06-14 LAB — MAGNESIUM: Magnesium: 2.1 mg/dL (ref 1.5–2.5)

## 2010-06-14 LAB — ALBUMIN: Albumin: 3.2 g/dL — ABNORMAL LOW (ref 3.5–5.2)

## 2010-06-14 LAB — PHOSPHORUS: Phosphorus: 2.7 mg/dL (ref 2.3–4.6)

## 2010-06-15 LAB — BASIC METABOLIC PANEL
CO2: 24 mEq/L (ref 19–32)
Chloride: 106 mEq/L (ref 96–112)
Creatinine, Ser: 0.75 mg/dL (ref 0.4–1.2)
GFR calc Af Amer: >60 mL/min (ref >60)
Potassium: 3.7 mEq/L (ref 3.5–5.1)

## 2010-06-15 LAB — ALBUMIN: Albumin: 3.1 g/dL — ABNORMAL LOW (ref 3.5–5.2)

## 2010-06-15 LAB — CBC
HCT: 35.6 % — ABNORMAL LOW (ref 36.0–46.0)
Hemoglobin: 12 g/dL (ref 12.0–15.0)
MCHC: 33.7 g/dL (ref 30.0–36.0)
RBC: 3.99 MIL/uL (ref 3.87–5.11)

## 2010-06-15 LAB — PHOSPHORUS: Phosphorus: 3.1 mg/dL (ref 2.3–4.6)

## 2010-06-15 LAB — DIFFERENTIAL
Basophils Absolute: 0 10*3/uL (ref 0.0–0.1)
Basophils Relative: 0 % (ref 0–1)
Monocytes Absolute: 0.9 10*3/uL (ref 0.1–1.0)
Monocytes Relative: 10 % (ref 3–12)

## 2010-06-15 LAB — MAGNESIUM: Magnesium: 2.4 mg/dL (ref 1.5–2.5)

## 2010-06-17 NOTE — Op Note (Signed)
NAME:  Cheryl Ballard, KREBBS NO.:  [id number]  MEDICAL RECORD NO.:  [id number]           PATIENT TYPE:  I  LOCATION:  A318                          FACILITY:  [facility]  PHYSICIAN:  Lazaro Arms, M.D.   DATE OF BIRTH:  13-Aug-1975  DATE OF PROCEDURE:  06/12/2010 DATE OF DISCHARGE:                              OPERATIVE REPORT   This is a portion of an operative note, which relates to the patient having a reanastomosis and colostomy takedown after a ruptured diverticulum and pelvic phlegmon.  At the original surgery, Dr. Lovell Sheehan noted that the patient had secondarily involvement of the pelvis with a possible communication with the posterior uterine wall and adhesions secondary to the infection/inflammation of the diverticular process to the both ovaries and tubes, left greater than right.  Today, Dr. Lovell Sheehan entered the abdomen through a vertical incision in a routine manner.  Please see his note for details and thereafter, a Alexis self-retaining wound retractor was placed, and the pelvis was reviewed.  The patient had dense adhesions of both ovaries and fallopian tubes to the pelvic sidewall and to the sigmoid colon.  These were taken down sharply with Metzenbaum scissors.  The sigmoid was completely freed up from the posterior uterus.  She did have an area of tenting of the rectum as it went course pass the posterior uterus, but the cul-de-sac was freed, but there is just a tenting of one area that we did not take down for concern over rectal injury.  Both fallopian tubes had an abnormal fimbriae of the right fallopian tube, basically was closed with no visible fimbriae and after all the adhesions were taken down, I used a lacrimal duct probe to identify the tubal ligament, which was patent and then did a fimbrioplasty sharply to try to expose the fimbriae.  At the end, I was moderately happy with the result of course because of the infectious and ensuing inflammatory  processes that occurred.  Her fimbriae are not normal, short, and there is not a great deal of surface area; however, at this point after the right fimbrioplasty, they are exposed and the tube is patent by lacrimal duct probe.  The left fallopian tube, the fimbriae again were short, but they were not clubbed, and fimbrioplasty did not need to be performed and again, lacrimal duct probe was used and found to be patent.  Again, the fimbriae were not normal.  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  We told her seriously in the future if pregnancy is a desire that IVF is probably going to be her best course of action.  At this point, I was done with the GYN portion of the surgery.  Dr. Lovell Sheehan then proceeded to do the colostomy takedown and planned anastomosis.  No other GYN or pelvic tissues remained, and so I scrubbed out of the case at this point.     Lazaro Arms, M.D.    Loraine Maple  D:  06/12/2010  T:  06/12/2010  Job:  [id number]  Electronically Signed by Duane Lope M.D. on 06/17/2010 10:05:18 AM

## 2010-08-26 NOTE — Discharge Summary (Signed)
  NAME:  Cheryl Ballard, RADA NO.:  [id number]  MEDICAL RECORD NO.:  [id number]  LOCATION:  A318                          FACILITY:  [facility]  PHYSICIAN:  Dalia Heading, M.D.  DATE OF BIRTH:  04/29/1975  DATE OF ADMISSION:  06/12/2010 DATE OF DISCHARGE:  04/14/2012LH                              DISCHARGE SUMMARY   HOSPITAL COURSE SUMMARY:  The patient is a 35 year old white female status post Hartmann procedure for perforated sigmoid diverticulitis in February 2012, who presented to the operating room on June 12, 2010, for a colostomy takedown.  She also underwent a pelvic exploration by Dr. Despina Hidden of Gynecology.  She tolerated the procedure well. Postoperative course was for the most part unremarkable.  Her diet was advanced without difficulty once her bowel function returned.  She did have a relative hypokalemia which was addressed without difficulty.  The patient was discharged home on June 15, 2010, in good improving condition.  DISCHARGE INSTRUCTIONS:  The patient was to follow up with Dr. Franky Macho in 1 week after discharge.  DISCHARGE MEDICATIONS: 1. Hydrocodone 1-2 tablets p.o. q.4 hours p.r.n. pain. 2. Ibuprofen 2 tablets p.o. q.8 hours p.r.n. pain. 3. Megace 1 tablet p.o. daily.  PRINCIPAL DIAGNOSIS:  History of perforated sigmoid diverticulitis, status post Hartmann procedure.  PRINCIPAL PROCEDURES:  Colostomy takedown and pelvic exploration by Ds. Lovell Sheehan and Dr. Despina Hidden on June 12, 2010.     Dalia Heading, M.D.     MAJ/MEDQ  D:  08/14/2010  T:  08/14/2010  Job:  [id number]  cc:   Lazaro Arms, M.D. Fax: [phone]  Electronically Signed by Franky Macho M.D. on 08/26/2010 07:27:44 AM

## 2011-01-22 ENCOUNTER — Emergency Department (HOSPITAL_COMMUNITY): Payer: 59

## 2011-01-22 ENCOUNTER — Emergency Department (HOSPITAL_COMMUNITY)
Admission: EM | Admit: 2011-01-22 | Discharge: 2011-01-23 | Disposition: A | Discharge status: Home / Self Care | Payer: 59 | Attending: Emergency Medicine | Admitting: Emergency Medicine

## 2011-01-22 ENCOUNTER — Encounter (HOSPITAL_COMMUNITY): Payer: Self-pay | Admitting: *Deleted

## 2011-01-22 DIAGNOSIS — F17200 Nicotine dependence, unspecified, uncomplicated: Secondary | ICD-10-CM | POA: Insufficient documentation

## 2011-01-22 DIAGNOSIS — N390 Urinary tract infection, site not specified: Secondary | ICD-10-CM | POA: Insufficient documentation

## 2011-01-22 DIAGNOSIS — R1032 Left lower quadrant pain: Secondary | ICD-10-CM | POA: Insufficient documentation

## 2011-01-22 HISTORY — DX: Diverticulitis of intestine, part unspecified, without perforation or abscess without bleeding: K57.92

## 2011-01-22 HISTORY — DX: Colostomy status: Z93.3

## 2011-01-22 LAB — URINALYSIS, ROUTINE W REFLEX MICROSCOPIC
Bilirubin Urine: NEGATIVE
Glucose, UA: NEGATIVE mg/dL
Ketones, ur: NEGATIVE mg/dL
Nitrite: POSITIVE — AB
Protein, ur: 30 mg/dL — AB
Specific Gravity, Urine: >1.03 — ABNORMAL HIGH (ref 1.005–1.030)
Urobilinogen, UA: 0.2 mg/dL (ref 0.0–1.0)
pH: 5.5 (ref 5.0–8.0)

## 2011-01-22 LAB — COMPREHENSIVE METABOLIC PANEL
ALT: 14 U/L (ref 0–35)
AST: 14 U/L (ref 0–37)
Albumin: 3.8 g/dL (ref 3.5–5.2)
Alkaline Phosphatase: 59 U/L (ref 39–117)
BUN: 16 mg/dL (ref 6–23)
CO2: 23 mEq/L (ref 19–32)
Calcium: 9.3 mg/dL (ref 8.4–10.5)
Chloride: 105 mEq/L (ref 96–112)
Creatinine, Ser: 0.82 mg/dL (ref 0.50–1.10)
GFR calc Af Amer: >90 mL/min (ref >90)
GFR calc non Af Amer: >90 mL/min (ref >90)
Glucose, Bld: 93 mg/dL (ref 70–99)
Potassium: 3.6 mEq/L (ref 3.5–5.1)
Sodium: 137 mEq/L (ref 135–145)
Total Bilirubin: 0.2 mg/dL — ABNORMAL LOW (ref 0.3–1.2)
Total Protein: 7.2 g/dL (ref 6.0–8.3)

## 2011-01-22 LAB — CBC
HCT: 39.2 % (ref 36.0–46.0)
Hemoglobin: 13.4 g/dL (ref 12.0–15.0)
MCH: 30.6 pg (ref 26.0–34.0)
MCHC: 34.2 g/dL (ref 30.0–36.0)
MCV: 89.5 fL (ref 78.0–100.0)
Platelets: 258 10*3/uL (ref 150–400)
RBC: 4.38 MIL/uL (ref 3.87–5.11)
RDW: 13.2 % (ref 11.5–15.5)
WBC: 10.5 10*3/uL (ref 4.0–10.5)

## 2011-01-22 LAB — URINE MICROSCOPIC-ADD ON

## 2011-01-22 LAB — PREGNANCY, URINE: Preg Test, Ur: NEGATIVE

## 2011-01-22 MED ORDER — CIPROFLOXACIN IN D5W 400 MG/200ML IV SOLN
400.0000 mg | Freq: Once | INTRAVENOUS | Status: AC
  Administered 2011-01-22: 400 mg via INTRAVENOUS
  Filled 2011-01-22: qty 200

## 2011-01-22 MED ORDER — CEFTRIAXONE SODIUM 250 MG IJ SOLR
250.0000 mg | Freq: Once | INTRAMUSCULAR | Status: DC
  Filled 2011-01-22: qty 250

## 2011-01-22 MED ORDER — CIPROFLOXACIN HCL 500 MG PO TABS: 500.0000 mg | ORAL_TABLET | Freq: Two times a day (BID) | ORAL | Status: AC

## 2011-01-22 MED ORDER — IOHEXOL 300 MG/ML  SOLN
100.0000 mL | Freq: Once | INTRAMUSCULAR | Status: AC | PRN
  Administered 2011-01-22: 100 mL via INTRAVENOUS

## 2011-01-22 MED ORDER — HYDROMORPHONE HCL PF 1 MG/ML IJ SOLN
0.5000 mg | Freq: Once | INTRAMUSCULAR | Status: AC
  Administered 2011-01-22: 21:00:00 via INTRAVENOUS
  Filled 2011-01-22: qty 1

## 2011-01-22 NOTE — ED Notes (Signed)
Pt c/o lower abd pian that started a few days ago, worse today, pt states that it started as vaginal pressure and pain is located in lower abd area and radiates to left lower back area, denies any n/v

## 2011-01-22 NOTE — ED Provider Notes (Signed)
History  Scribed for Raeford Razor, MD, the patient was seen in room [facility]19. This chart was scribed by Hillery Hunter.   CSN: [id number] Arrival date & time: 01/22/2011  8:15 PM   First MD Initiated Contact with Patient 01/22/11 2018      Chief Complaint  Patient presents with  . Abdominal Pain   The history is provided by the patient.  Cheryl Ballard is a 35 y.o. female who presents to the Emergency Department complaining of lower abdominal pain that started 2 days ago as a "vaginal pressure." The pain is constant, radiates around to her lower back, and is worse with movement. Pt reports history of perforated diverticulitis with bowel resection 9 months ago with reversal 7 months ago.  Pt complains of chronic intermittent incontinence, some vaginal discharge, intermittent constipation. Pt denies chills, nausea, diarrhea, current pregnancy and hx of kidney stones.    Past Medical History  Diagnosis Date  . Diverticulitis   . S/P colostomy     had colostomy with surgery, but has been reversed    Past Surgical History  Procedure Date  . Bowel resection     History reviewed. No pertinent family history.  History  Substance Use Topics  . Smoking status: Current Everyday Smoker -- 0.5 packs/day  . Smokeless tobacco: Not on file  . Alcohol Use: Yes     occasional     Review of Systems   Review of symptoms negative unless otherwise noted in HPI.   Allergies  Review of patient's allergies indicates no known allergies.  Home Medications   Current Outpatient Rx  Name Route Sig Dispense Refill  . IBUPROFEN 200 MG PO TABS Oral Take 800 mg by mouth as needed. For pain     . PHENAZOPYRIDINE HCL 200 MG PO TABS Oral Take 200 mg by mouth 3 (three) times daily.        Triage vitals: BP 142/99  Pulse 88  Temp(Src) 98.4 F (36.9 C) (Oral)  Resp 16  Ht 5' 4.5" (1.638 m)  Wt 170 lb (77.111 kg)  BMI 28.73 kg/m2  SpO2 98%  LMP 01/02/2011  Physical Exam    Constitutional: She is oriented to person, place, and time. She appears well-developed and well-nourished.  HENT:  Head: Normocephalic and atraumatic.  Cardiovascular: Normal rate, regular rhythm and normal heart sounds.   Pulmonary/Chest: Effort normal and breath sounds normal.  Abdominal: Soft. She exhibits no distension and no mass. There is tenderness. There is no rebound and no guarding.       suprapubic and RLQ tenderness Well-healed lap scar no CVA tenderness  Neurological: She is alert and oriented to person, place, and time.  Skin: Skin is warm and dry.    ED Course  Procedures   Labs Reviewed  URINALYSIS, ROUTINE W REFLEX MICROSCOPIC - Abnormal; Notable for the following:    Appearance HAZY (*)    Specific Gravity, Urine >1.030 (*)    Hgb urine dipstick LARGE (*)    Protein, ur 30 (*)    Nitrite POSITIVE (*)    Leukocytes, UA MODERATE (*)    All other components within normal limits  COMPREHENSIVE METABOLIC PANEL - Abnormal; Notable for the following:    Total Bilirubin 0.2 (*)    All other components within normal limits  URINE MICROSCOPIC-ADD ON - Abnormal; Notable for the following:    Squamous Epithelial / LPF MANY (*)    Bacteria, UA MANY (*)    All other components within normal  limits  PREGNANCY, URINE  CBC  LAB REPORT - SCANNED   No results found.  Ct Abdomen Pelvis W Contrast  01/22/2011  *RADIOLOGY REPORT*  Clinical Data: Suprapubic pain, right lower quadrant abdominal pain, and left lower quadrant abdominal pain.  History of colonic resection with colostomy and reversal.  CT ABDOMEN AND PELVIS WITH CONTRAST 01/22/2011:  Technique:  Multidetector CT imaging of the abdomen and pelvis was performed following the standard protocol during bolus administration of intravenous contrast.  Contrast: OMNIPAQUE IOHEXOL 300 MG/ML IV SOLN Oral contrast was also administered.  Comparison: CT abdomen and pelvis 04/06/2010 and the [facility].  Findings: Large  cyst in the left adnexa, presumably ovarian, measuring approximately 7.5 x 5.5 cm.  Free fluid in the left adnexa and in the cul-de-sac.  Small right ovarian cyst approximating 3.3 x 2.5 cm.  Approximate 2.5 x 2.0 cm fibroid involving the left lower uterine body.  Endometrial thickening and/or fluid, likely related to late proliferative or secretory phase of menses.  Normal appearing liver, spleen, pancreas, adrenal glands, and left kidney.  Approximate 1 cm simple cyst upper pole right kidney, otherwise normal.  Gallbladder unremarkable by CT.  No biliary ductal dilation.  No visible aorto-iliofemoral atherosclerosis.  No significant lymphadenopathy.  Colorectal anastomosis widely patent.  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  Cecum extends low in the right side of the pelvis.  Normal appendix extending upward from the right low pelvis into the upper pelvis.  Urinary bladder decompressed and unremarkable.  Bone window images unremarkable.  Visualized lung bases clear. Heart size normal.  IMPRESSION:  1.  Approximate 7.5 x 5.5 cm left ovarian cyst.  Free fluid in the left adnexa and in the cul-de-sac is consistent with recent cyst rupture. 2.  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 3.  No significant abnormalities otherwise.  This is almost certainly benign, but follow up ultrasound is recommended in 1 year according to the Society of Radiologists in Ultrasound 2010 Consensus Conference Statement (D Lenis Noon et al. Management of Asymptomatic Ovarian and Other Adnexal Cysts Imaged at [place]:  Society of Radiologists in Ultrasound Consensus Conference Statement 2010.   Radiology 256 (Sept 2010):  943-954.).  Original Report Authenticated By: Arnell Sieving, M.D.     1. UTI (lower urinary tract infection)   2. Abdominal pain     OTHER DATA  REVIEWED: Nursing notes, vital signs, and past medical records reviewed.    DIAGNOSTIC STUDIES: Oxygen Saturation is 98% on room air, normal by my interpretation.     ED COURSE / COORDINATION OF CARE: 20:17. Ordered: Urinalysis with microscopic ; Pregnancy, urine  20:43. Ordered CT Abdomen Pelvis W Contrast ; CBC ; Comprehensive metabolic panel ; Saline lock IV.  21:05. Ordered HYDROmorphone (DILAUDID) injection 0.5 mg  21:47. Discussed lab results evident for UTI and clinical suspicion is low for any other abdominal etiology. Addressed patient concerns regarding her suprapubic pain and agreed to patient request for CT of her abdomen after discussing risks and benefits associated with radiation.  21:57. Ordered iohexol (OMNIPAQUE) 300 MG/ML injection 100 mL     MDM  35yf with lower abdominal pain. Likely 2/2 uti and also possibly ovarian cyst. Doubt torsion given pain on R cyst on L adnexa. Quinolone given recent uti tx'd with keflex. CT preformed because pt concerned for alternative etiology. Did not think entirely unreasonable given pt's  not too distant complicated course of diverticulitis.  No CT evidence of alternative reason for symptoms.        I personally preformed the services scribed in my presence. The recorded information has been reviewed and considered. Raeford Razor, MD.   Raeford Razor, MD 01/27/11 [phone]

## 2011-02-18 ENCOUNTER — Ambulatory Visit (INDEPENDENT_AMBULATORY_CARE_PROVIDER_SITE_OTHER): Payer: 59 | Admitting: Internal Medicine

## 2011-02-18 ENCOUNTER — Other Ambulatory Visit (HOSPITAL_COMMUNITY)
Admission: RE | Admit: 2011-02-18 | Discharge: 2011-02-18 | Disposition: A | Discharge status: Home / Self Care | Payer: 59 | Source: Ambulatory Visit | Attending: Obstetrics and Gynecology | Admitting: Obstetrics and Gynecology

## 2011-02-18 ENCOUNTER — Encounter (INDEPENDENT_AMBULATORY_CARE_PROVIDER_SITE_OTHER): Payer: Self-pay | Admitting: Internal Medicine

## 2011-02-18 ENCOUNTER — Other Ambulatory Visit: Payer: Self-pay | Admitting: Family Medicine

## 2011-02-18 DIAGNOSIS — Z01419 Encounter for gynecological examination (general) (routine) without abnormal findings: Secondary | ICD-10-CM | POA: Insufficient documentation

## 2011-02-18 DIAGNOSIS — K5792 Diverticulitis of intestine, part unspecified, without perforation or abscess without bleeding: Secondary | ICD-10-CM | POA: Insufficient documentation

## 2011-02-18 DIAGNOSIS — Z9889 Other specified postprocedural states: Secondary | ICD-10-CM

## 2011-02-18 DIAGNOSIS — K5732 Diverticulitis of large intestine without perforation or abscess without bleeding: Secondary | ICD-10-CM

## 2011-02-18 DIAGNOSIS — Z9049 Acquired absence of other specified parts of digestive tract: Secondary | ICD-10-CM

## 2011-02-18 NOTE — Patient Instructions (Signed)
Will f/u in 6 months

## 2011-02-18 NOTE — Progress Notes (Signed)
Subjective:     Patient ID: Cheryl Ballard, female   DOB: 08/03/1975, 35 y.o.   MRN: [id number]  HPI Cheryl Ballard is a 35 yr old female referred to our office by Dr. Despina Hidden for a diverticulum on CT. She tells me that she was having lower and upper abdominal pain (see below)      She is not having any abdominal pain now.  She actually feels good. Appetite is good. No weight loss.  She usually has a BM about one a day. No rectal bleeding or melena.   She denies any GI problems.   Orian was seen by Dr. 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  A CT scan revealed pneumoperitoneum with pelvic abscess, inflamed sigmoid colon, and fluid in the uterus and vagina. She underwent an exploratory laparotomy with temporary colostomy placement. (Status post Luz Brazen procedure for perforated sigmoid diverticulitis.  Biopsy: Acute diverticulitis with associated acute abscess and perforation. Diverticulosis. No evidence of atypical or malignancy. Five lymph nodes, negative for neoplasm. Resection margin are viable.   In April of 2012 she underwent a colostomy takedown and pelvic exploration.  CT scan in November 2012:IMPRESSION:  1. Approximate 7.5 x 5.5 cm left ovarian cyst. Free fluid in the  left adnexa and in the cul-de-sac is consistent with recent cyst  rupture.  2. 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  3. No significant abnormalities otherwise.  This is almost certainly benign, but follow up ultrasound is  recommended in 1 year according to the Society of Radiologists in  Ultrasound 2010 Consensus Conference Statement (D Lenis Noon et al.  Management of Asymptomatic Ovarian and Other Adnexal Cysts Imaged  at [place]: Society of Radiologists in Ultrasound Consensus Conference  Statement 2010. Radiology 256 (Sept 2010): 943-954.).   Review of Systems see hpi Current Outpatient Prescriptions    Medication Sig Dispense Refill  . buPROPion (WELLBUTRIN XL) 150 MG 24 hr tablet Take 150 mg by mouth daily.        Marland Kitchen ibuprofen (ADVIL,MOTRIN) 200 MG tablet Take 800 mg by mouth as needed. For pain        Past Medical History  Diagnosis Date  . Diverticulitis   . S/P colostomy     had colostomy with surgery, but has been reversed   Past Surgical History  Procedure Date  . Bowel resection   . Colostomy in april     reversal in April   History   Social History  . Marital Status: Widowed    Spouse Name: N/A    Number of Children: N/A  . Years of Education: N/A   Occupational History  . Not on file.   Social History Main Topics  . Smoking status: Current Everyday Smoker -- 0.5 packs/day  . Smokeless tobacco: Not on file   Comment: 1 pack a day x 19 yrs  . Alcohol Use: Yes     occasional  . Drug Use: No  . Sexually Active: Not on file   Other Topics Concern  . Not on file   Social History Narrative  . No narrative on file   Family Status  Relation Status Death Age  . Mother Alive     good health, Parkinson, CAD  . Father Deceased     MVC  . Brother Deceased     Probable CVA   No Known Allergies      Objective:  Physical Exam     Dr. Karilyn Cota present in the room and also interviewed patient. Alert and oriented. Skin warm and dry. Oral mucosa is moist. Natural teeth in good condition. Sclera anicteric, conjunctivae is pink. Thyroid not enlarged. No cervical lymphadenopathy. Lungs clear. Heart regular rate and rhythm.  Abdomen is soft. Bowel sounds are positive. No hepatomegaly. No abdominal masses felt. No tenderness.  No edema to lower extremities. Patient is alert and oriented.     Assessment:   Hx of perforated distal sigmoid colon and abscess from acute diverticulitis. Colon resection with colostomy and then take down.    Plan:    No problems at this time. She should follow up with Dr, Despina Hidden concerning left ovarian cyst. F/u in our office in 6 months

## 2011-02-19 ENCOUNTER — Encounter (INDEPENDENT_AMBULATORY_CARE_PROVIDER_SITE_OTHER): Payer: Self-pay | Admitting: Internal Medicine

## 2011-08-14 ENCOUNTER — Encounter (INDEPENDENT_AMBULATORY_CARE_PROVIDER_SITE_OTHER): Payer: Self-pay | Admitting: *Deleted

## 2011-08-28 ENCOUNTER — Ambulatory Visit (INDEPENDENT_AMBULATORY_CARE_PROVIDER_SITE_OTHER): Payer: 59 | Admitting: Internal Medicine

## 2011-09-01 ENCOUNTER — Ambulatory Visit (INDEPENDENT_AMBULATORY_CARE_PROVIDER_SITE_OTHER): Payer: 59 | Admitting: Internal Medicine

## 2012-05-13 ENCOUNTER — Ambulatory Visit (INDEPENDENT_AMBULATORY_CARE_PROVIDER_SITE_OTHER): Payer: 59 | Admitting: Cardiovascular Disease

## 2012-05-13 ENCOUNTER — Encounter: Payer: Self-pay | Admitting: Cardiovascular Disease

## 2012-05-13 VITALS — BP 118/78 | HR 77 | Ht 65.0 in | Wt 173.0 lb

## 2012-05-13 DIAGNOSIS — R002 Palpitations: Secondary | ICD-10-CM

## 2012-05-13 LAB — HEPATIC FUNCTION PANEL
ALT: 15 U/L (ref 0–35)
AST: 16 U/L (ref 0–37)
Bilirubin, Direct: 0.1 mg/dL (ref 0.0–0.3)
Total Bilirubin: 0.8 mg/dL (ref 0.3–1.2)

## 2012-05-13 LAB — LIPID PANEL
Cholesterol: 146 mg/dL (ref 0–200)
HDL: 60.3 mg/dL (ref >39.00)
Total CHOL/HDL Ratio: 2
Triglycerides: 59 mg/dL (ref 0.0–149.0)
VLDL: 11.8 mg/dL (ref 0.0–40.0)

## 2012-05-13 NOTE — Patient Instructions (Addendum)
Your physician wants you to follow-up in:  2 years.  You will receive a reminder letter in the mail two months in advance. If you don't receive a letter, please call our office to schedule the follow-up appointment.  We will call you with the results of lab work done today

## 2012-05-13 NOTE — Progress Notes (Signed)
HPI:   37 year old woman presenting for initial evaluation of cardiac palpitations. She works as a Engineer, civil (consulting) at [employer]. She had been experiencing nocturnal palpitations with a feeling of skipped heartbeats in her chest and neck. These have occurred only at rest. She cut down on caffeine dramatically about 3 weeks ago and her symptoms are nearly resolved. She has not had any exertional chest pain or pressure, palpitations, or dyspnea. She denies leg swelling, orthopnea, or PND. She otherwise feels well.  She does note that she has gained 20 pounds in the past year. She's been less active.  She is primarily concerned about her family history. Her brother died suddenly at age 30. He apparently was short of breath and shortly thereafter was found dead. Her mother has atrial fibrillation. Her father died at age 67 motor vehicle accident.  The patient's medical history includes diverticular disease. She had a colonic perforation in 2012 requiring surgery. She had a colostomy for about 3 months. She has fully recovered from this and has no residual medical problems. She specifically denies any history of hypertension, diabetes, or dyslipidemia. Her cholesterol was last checked about 3 years ago.  No outpatient encounter prescriptions on file as of 05/13/2012.   No facility-administered encounter medications on file as of 05/13/2012.    Review of patient's allergies indicates no known allergies.  History reviewed. No pertinent past medical history.  History reviewed. No pertinent past surgical history.  History   Social History  . Marital Status: Single    Spouse Name: N/A    Number of Children: N/A  . Years of Education: N/A   Occupational History  . Not on file.   Social History Main Topics  . Smoking status: Current Some Day Smoker  . Smokeless tobacco: Not on file     Comment: Patient states that she is trying to quit.  . Alcohol Use: Not on file  . Drug Use: Not on file  .  Sexually Active: Not on file   Other Topics Concern  . Not on file   Social History Narrative  . No narrative on file   Family history: See discussion in history of present illness  ROS:  General: no fevers/chills/night sweats. Positive for 20 pound weight gain in one year Eyes: no blurry vision, diplopia, or amaurosis ENT: no sore throat or hearing loss Resp: no cough, wheezing, or hemoptysis CV: no edema or palpitations GI: no abdominal pain, nausea, vomiting, diarrhea, or constipation GU: no dysuria, frequency, or hematuria Skin: no rash Neuro: no headache, numbness, tingling, or weakness of extremities Musculoskeletal: no joint pain or swelling Heme: no bleeding, DVT, or easy bruising Endo: no polydipsia or polyuria  BP 118/78  Pulse 77  Ht 5\' 5"  (1.651 m)  Wt 78.472 kg (173 lb)  BMI 28.79 kg/m2  PHYSICAL EXAM: Pt is alert and oriented, WD, WN, in no distress. HEENT: normal Neck: JVP normal. Carotid upstrokes normal without bruits. No thyromegaly. Lungs: equal expansion, clear bilaterally CV: Apex is discrete and nondisplaced, RRR without murmur or gallop Abd: soft, NT, +BS, no bruit, no hepatosplenomegaly Back: no CVA tenderness Ext: no C/C/E        Femoral pulses 2+= without bruits        DP/PT pulses intact and = Skin: warm and dry without rash Neuro: CNII-XII intact             Strength intact = bilaterally  EKG:  Normal sinus rhythm with sinus arrhythmia, heart rate 77  beats per minute, within normal limits  ASSESSMENT AND PLAN: 1. Cardiac palpitations. The patient has improved with caffeine reduction. She has a normal cardiac exam and I do not have any suspicion for structural heart disease. Her 12-lead EKG is normal. I reassured her and asked her to contact the office if her palpitations recur or if they progress.  2. Cardiac risk. The patient does have a strong family history of cardiac disease. Her brother died suddenly at a young age with a prodrome of  shortness of breath and there was concern that this was a primary cardiac event. I have recommended a lipid panel and a CRP to help with risk stratification. Would have a relatively low threshold to consider a statin drug. Otherwise I don't see any indication for stress testing. The patient is completely asymptomatic with a good work level and she has no symptoms with exertion.  Tonny Bollman 05/13/2012 2:09 PM

## 2012-05-20 ENCOUNTER — Telehealth: Payer: Self-pay | Admitting: Cardiovascular Disease

## 2012-05-20 NOTE — Telephone Encounter (Signed)
Lab results were given to pt. 

## 2012-05-20 NOTE — Telephone Encounter (Signed)
New Problem    Per pt a Debbie called but did not leave a last name and pt is returning call

## 2013-01-24 ENCOUNTER — Other Ambulatory Visit: Payer: Self-pay | Admitting: Women's Health

## 2013-01-24 ENCOUNTER — Telehealth: Payer: Self-pay | Admitting: Women's Health

## 2013-01-24 ENCOUNTER — Ambulatory Visit (INDEPENDENT_AMBULATORY_CARE_PROVIDER_SITE_OTHER): Payer: 59 | Admitting: Women's Health

## 2013-01-24 ENCOUNTER — Encounter: Payer: Self-pay | Admitting: Women's Health

## 2013-01-24 ENCOUNTER — Other Ambulatory Visit (HOSPITAL_COMMUNITY)
Admission: RE | Admit: 2013-01-24 | Discharge: 2013-01-24 | Disposition: A | Discharge status: Home / Self Care | Payer: 59 | Source: Ambulatory Visit | Attending: Obstetrics & Gynecology | Admitting: Obstetrics & Gynecology

## 2013-01-24 VITALS — BP 130/82 | Ht 64.5 in

## 2013-01-24 DIAGNOSIS — K5792 Diverticulitis of intestine, part unspecified, without perforation or abscess without bleeding: Secondary | ICD-10-CM | POA: Insufficient documentation

## 2013-01-24 DIAGNOSIS — Z01419 Encounter for gynecological examination (general) (routine) without abnormal findings: Secondary | ICD-10-CM

## 2013-01-24 DIAGNOSIS — Z1151 Encounter for screening for human papillomavirus (HPV): Secondary | ICD-10-CM | POA: Insufficient documentation

## 2013-01-24 DIAGNOSIS — N6310 Unspecified lump in the right breast, unspecified quadrant: Secondary | ICD-10-CM

## 2013-01-24 LAB — COMPREHENSIVE METABOLIC PANEL
AST: 14 U/L (ref 0–37)
Albumin: 4.4 g/dL (ref 3.5–5.2)
BUN: 11 mg/dL (ref 6–23)
Calcium: 9.4 mg/dL (ref 8.4–10.5)
Chloride: 105 mEq/L (ref 96–112)
Creat: 0.97 mg/dL (ref 0.50–1.10)
Glucose, Bld: 80 mg/dL (ref 70–99)

## 2013-01-24 LAB — CBC
HCT: 43.5 % (ref 36.0–46.0)
Hemoglobin: 15.1 g/dL — ABNORMAL HIGH (ref 12.0–15.0)
MCHC: 34.7 g/dL (ref 30.0–36.0)
MCV: 90.6 fL (ref 78.0–100.0)
RDW: 13.2 % (ref 11.5–15.5)
WBC: 6.5 10*3/uL (ref 4.0–10.5)

## 2013-01-24 NOTE — Patient Instructions (Signed)
Wed 12/17 @ 8:30 at [facility], be there at 8:15. No deodorant or lotion Smoking Cessation Quitting smoking is important to your health and has many advantages. However, it is not always easy to quit since nicotine is a very addictive drug. Often times, people try 3 times or more before being able to quit. This document explains the best ways for you to prepare to quit smoking. Quitting takes hard work and a lot of effort, but you can do it. ADVANTAGES OF QUITTING SMOKING  You will live longer, feel better, and live better.  Your body will feel the impact of quitting smoking almost immediately.  Within 20 minutes, blood pressure decreases. Your pulse returns to its normal level.  After 8 hours, carbon monoxide levels in the blood return to normal. Your oxygen level increases.  After 24 hours, the chance of having a heart attack starts to decrease. Your breath, hair, and body stop smelling like smoke.  After 48 hours, damaged nerve endings begin to recover. Your sense of taste and smell improve.  After 72 hours, the body is virtually free of nicotine. Your bronchial tubes relax and breathing becomes easier.  After 2 to 12 weeks, lungs can hold more air. Exercise becomes easier and circulation improves.  The risk of having a heart attack, stroke, cancer, or lung disease is greatly reduced.  After 1 year, the risk of coronary heart disease is cut in half.  After 5 years, the risk of stroke falls to the same as a nonsmoker.  After 10 years, the risk of lung cancer is cut in half and the risk of other cancers decreases significantly.  After 15 years, the risk of coronary heart disease drops, usually to the level of a nonsmoker.  If you are pregnant, quitting smoking will improve your chances of having a healthy baby.  The people you live with, especially any children, will be healthier.  You will have extra money to spend on things other than cigarettes. QUESTIONS TO THINK ABOUT BEFORE  ATTEMPTING TO QUIT You may want to talk about your answers with your caregiver.  Why do you want to quit?  If you tried to quit in the past, what helped and what did not?  What will be the most difficult situations for you after you quit? How will you plan to handle them?  Who can help you through the tough times? Your family? Friends? A caregiver?  What pleasures do you get from smoking? What ways can you still get pleasure if you quit? Here are some questions to ask your caregiver:  How can you help me to be successful at quitting?  What medicine do you think would be best for me and how should I take it?  What should I do if I need more help?  What is smoking withdrawal like? How can I get information on withdrawal? GET READY  Set a quit date.  Change your environment by getting rid of all cigarettes, ashtrays, matches, and lighters in your home, car, or work. Do not let people smoke in your home.  Review your past attempts to quit. Think about what worked and what did not. GET SUPPORT AND ENCOURAGEMENT You have a better chance of being successful if you have help. You can get support in many ways.  Tell your family, friends, and co-workers that you are going to quit and need their support. Ask them not to smoke around you.  Get individual, group, or telephone counseling and support.  Programs are available at [employer] and health centers. Call your local health department for information about programs in your area.  Spiritual beliefs and practices may help some smokers quit.  Download a "quit meter" on your computer to keep track of quit statistics, such as how long you have gone without smoking, cigarettes not smoked, and money saved.  Get a self-help book about quitting smoking and staying off of tobacco. LEARN NEW SKILLS AND BEHAVIORS  Distract yourself from urges to smoke. Talk to someone, go for a walk, or occupy your time with a task.  Change your normal  routine. Take a different route to work. Drink tea instead of coffee. Eat breakfast in a different place.  Reduce your stress. Take a hot bath, exercise, or read a book.  Plan something enjoyable to do every day. Reward yourself for not smoking.  Explore interactive web-based programs that specialize in helping you quit. GET MEDICINE AND USE IT CORRECTLY Medicines can help you stop smoking and decrease the urge to smoke. Combining medicine with the above behavioral methods and support can greatly increase your chances of successfully quitting smoking.  Nicotine replacement therapy helps deliver nicotine to your body without the negative effects and risks of smoking. Nicotine replacement therapy includes nicotine gum, lozenges, inhalers, nasal sprays, and skin patches. Some may be available over-the-counter and others require a prescription.  Antidepressant medicine helps people abstain from smoking, but how this works is unknown. This medicine is available by prescription.  Nicotinic receptor partial agonist medicine simulates the effect of nicotine in your brain. This medicine is available by prescription. Ask your caregiver for advice about which medicines to use and how to use them based on your health history. Your caregiver will tell you what side effects to look out for if you choose to be on a medicine or therapy. Carefully read the information on the package. Do not use any other product containing nicotine while using a nicotine replacement product.  RELAPSE OR DIFFICULT SITUATIONS Most relapses occur within the first 3 months after quitting. Do not be discouraged if you start smoking again. Remember, most people try several times before finally quitting. You may have symptoms of withdrawal because your body is used to nicotine. You may crave cigarettes, be irritable, feel very hungry, cough often, get headaches, or have difficulty concentrating. The withdrawal symptoms are only temporary.  They are strongest when you first quit, but they will go away within 10 14 days. To reduce the chances of relapse, try to:  Avoid drinking alcohol. Drinking lowers your chances of successfully quitting.  Reduce the amount of caffeine you consume. Once you quit smoking, the amount of caffeine in your body increases and can give you symptoms, such as a rapid heartbeat, sweating, and anxiety.  Avoid smokers because they can make you want to smoke.  Do not let weight gain distract you. Many smokers will gain weight when they quit, usually less than 10 pounds. Eat a healthy diet and stay active. You can always lose the weight gained after you quit.  Find ways to improve your mood other than smoking. FOR MORE INFORMATION  www.smokefree.gov  Document Released: 02/11/2001 Document Revised: 08/19/2011 Document Reviewed: 05/29/2011 [facility] Patient Information 2014 [place], [place].

## 2013-01-24 NOTE — Progress Notes (Signed)
Patient ID: Cheryl Ballard, female   DOB: 1976/02/01, 37 y.o.   MRN: [id number] Subjective:     Cheryl Ballard is a 37 y.o. Caucasian G2P0020  female here for a routine well-woman exam.  Patient's last menstrual period was 12/26/2012. Reports having intentionally lost 17lbs since Sept, she is working out at [employer] on her days off.  She is in a mutually monogamous relationship x 32yrs.   Smoking Status: 1ppd, trying to quit. Has tried chantix & patches w/o any success. Hasn't tried nicotine gum.  Does desire labs.Had normal lipid profile in March 2014 w/ Dr. [facility]wynn[facility]  She saw Dr. Excell Seltzer, [facility] in March 2014 as a precautionary measure d/t her mother and deceased brother having Afib. Her brother suddenly passed away 36yrs ago d/t presumed heart disease at 37yo. She had a normal EKG, lipid profile and CRP at that time. Pt reports he would like to see her again in 27yrs.     Gynecologic History Patient's last menstrual period was 12/26/2012. Contraception: none, not actively trying to conceived, has been told that her ovaries/tubes have lots of adhesions from past abdominal surgeries.  Last Pap: 2013. Results were: normal Last mammogram: had dx mammogram ordered by Dr. Lisette Grinder many yrs ago. Results were: normal fibrocystic changes.   Obstetric History OB History  No data available  G2P0020  Past Medical History  Diagnosis Date  . Diverticulitis   w/ colonic perforation requiring temporary colostomy and subsequent reversal in 2012, reports not needing to f/u w/ GI unless develops any complications  The following portions of the patient's history were reviewed and updated as appropriate: allergies, current medications, past family history, past medical history, past social history, past surgical history and problem list.  No current outpatient prescriptions on file.   Review of Systems  Review of Systems  Constitutional: Negative for fever, chills, weight loss,  malaise/fatigue and diaphoresis.  HENT: Negative for hearing loss, ear pain, nosebleeds, congestion, sore throat, neck       pain, tinnitus and ear discharge.   Eyes: Negative for blurred vision, double vision, photophobia, pain, discharge and       redness.  Respiratory: Negative for cough, hemoptysis, sputum production, shortness of breath,       wheezing and stridor.   Cardiovascular: Negative for chest pain, palpitations, orthopnea, claudication, leg       swelling and PND.  Gastrointestinal:  Negative for heartburn, nausea, vomiting, diarrhea, constipation, blood in stool and melena. Has chronic Lt abdominal tenderness under surgical scar 'from adhesions'. Reports h/o IBS, not unusual to have 3 soft formed bm's q am.  Genitourinary: Negative for dysuria, urgency, frequency, hematuria, flank pain,      and dyspareunia. Occ has some postvoiding incontinence Musculoskeletal: Negative for myalgias, back pain, joint pain and falls.  Skin: Negative for itching and rash.  Neurological: Negative for dizziness, tingling, tremors, sensory change, speech change,     focal weakness, seizures, loss of consciousness, weakness and headaches.  Endo/Heme/Allergies: Negative for environmental allergies and polydipsia. Does not       bruise/bleed easily.  Psychiatric/Behavioral: Negative for depression, suicidal ideas, hallucinations, memory loss, substance abuse, insomnia.  Does report some anxiety, reports having had a lot of bad things happen in her life which makes her anxious about future.       Objective:     Physical Exam  BP 130/82  Ht 5' 4.5" (1.638 m)  LMP 12/26/2012 Declined to be weighed d/t difference in our scales and  didn't want to be discouraged. She is aware of her current weight and is currently working out and actively losing weight.  Constitutional: She is oriented to person, place, and time. She appears well-developed    and well-nourished.  HEENT:     Head: Normocephalic and  atraumatic.           Right Ear: External ear normal.     Left Ear: External ear normal.     Nose: Nose normal.     Mouth/Throat: Oropharynx is clear and moist.     Eyes: Conjunctivae and EOM are normal. Pupils are equal, round, and reactive to light.    Right eye exhibits no discharge. Left eye exhibits no discharge. No scleral icterus.  Neck: Normal range of motion. Neck supple. No tracheal deviation present. No          thyromegaly present.  Cardiovascular: Normal rate, regular rhythm, normal heart sounds and intact distal          pulses.  Exam reveals no gallop and no friction rub.  No murmur heard. Respiratory: Effort normal and breath sounds normal. No respiratory distress. She has       no wheezes. She has no rales. She exhibits no tenderness.  Breasts: ~3cm mobile slightly tender mass 10 o'clock Rt breast, ~18fb from areola. No retraction or nipple discharge. Co-exam w/ JAG.  GI: Soft. Bowel sounds are normal. She exhibits no distension and no mass. There is no rebound and no guarding. Slight tenderness Lt lower abdomen at site of surgical scar.     Hemoccult: n/a Genitourinary:     Vulva is normal without lesions    Vagina is pink moist without discharge    Cervix normal in appearance and thin prep pap is done w/ HR HPV cotesting    Uterus is normal size shape and contour    Adnexa is negative with normal sized ovaries   Musculoskeletal: Normal range of motion. She exhibits no edema and no tenderness.  Neurological: She is alert and oriented to person, place, and time. She has normal    reflexes. She displays normal reflexes. No cranial nerve deficit. She exhibits normal    muscle tone. Coordination normal.  Skin: Skin is warm and dry. No rash noted. No erythema. No pallor.  Psychiatric: She has a normal mood and affect. Her behavior is normal. Judgment and    thought content normal.   Assessment:     Healthy well-woman exam. Rt breast mass Smoker H/O diverticulitis w/  colonic perforation requiring temp colostomy w/ subsequent reversal  Occ post-voiding incontinence      Plan:  CBC, CMP, TSH today Diagnostic mammogram w/ u/s if indicated on Wed 12/17 @ 0830 To try nicotine gum, smoking cessation info given To try double voiding, kegel exercises Continue exercising Relaxation/anxiety decreasing exercises Colonoscopy @ 37yo or sooner if problems F/U 71yr for physical +/- pap depending on today's results  Marge Duncans 01/24/2013 12:38 PM

## 2013-01-24 NOTE — Telephone Encounter (Signed)
For post-voiding incontinence to try double voiding: urinate, stand up, then sit back down and try urinating again. 100 kegels/day. If not improving in 1 month, to call back.  Cheral Marker, CNM, [facility] 01/24/2013 1:38 PM

## 2013-02-16 ENCOUNTER — Other Ambulatory Visit: Payer: Self-pay | Admitting: Women's Health

## 2013-02-16 ENCOUNTER — Ambulatory Visit (HOSPITAL_COMMUNITY)
Admission: RE | Admit: 2013-02-16 | Discharge: 2013-02-16 | Disposition: A | Discharge status: Home / Self Care | Payer: 59 | Source: Ambulatory Visit | Attending: Women's Health | Admitting: Women's Health

## 2013-02-16 DIAGNOSIS — N6310 Unspecified lump in the right breast, unspecified quadrant: Secondary | ICD-10-CM

## 2013-02-16 DIAGNOSIS — N63 Unspecified lump in unspecified breast: Secondary | ICD-10-CM | POA: Insufficient documentation

## 2013-02-19 ENCOUNTER — Encounter: Payer: Self-pay | Admitting: Women's Health

## 2013-02-19 DIAGNOSIS — N6310 Unspecified lump in the right breast, unspecified quadrant: Secondary | ICD-10-CM | POA: Insufficient documentation

## 2013-06-14 ENCOUNTER — Ambulatory Visit (INDEPENDENT_AMBULATORY_CARE_PROVIDER_SITE_OTHER): Payer: 59 | Admitting: Internal Medicine

## 2013-06-14 ENCOUNTER — Encounter (INDEPENDENT_AMBULATORY_CARE_PROVIDER_SITE_OTHER): Payer: Self-pay | Admitting: Internal Medicine

## 2013-06-14 VITALS — BP 104/82 | HR 80 | Temp 98.2°F | Ht 65.0 in | Wt 175.4 lb

## 2013-06-14 DIAGNOSIS — A084 Viral intestinal infection, unspecified: Secondary | ICD-10-CM | POA: Insufficient documentation

## 2013-06-14 DIAGNOSIS — A088 Other specified intestinal infections: Secondary | ICD-10-CM

## 2013-06-14 NOTE — Progress Notes (Signed)
Subjective:     Patient ID: Cheryl Ballard, female   DOB: [birth year], 38 y.o.   MRN: [id number]  HPIHere today with c/o bloating in her epigastric region. She tells me she was running a fever. She had nausea but no vomiting. She remained on a clear liquid diet Sunday. Symptoms lasted for one day. Her stools were green Sunday. She says her stools are always loose. Her stools have been loose for years. Partial resection in 2012 of her colon for ruptured diverticulum.  Four inches of her colon was removed. She has acid reflux from weight gain.  Appetite is good.  She is having about 0-6 stools a day.  Stool are loose. No recent antibiotics. She has had loose stools for years. No rectal bleeding or melena.  She is symptom free at this time.   Review of Systems  Past Medical History  Diagnosis Date  . Diverticulitis   . S/P colostomy     had colostomy with surgery, but has been reversed    Past Surgical History  Procedure Laterality Date  . Bowel resection    . Colostomy in april      reversal in April 2013    No Known Allergies  Current Outpatient Prescriptions on File Prior to Visit  Medication Sig Dispense Refill  . ibuprofen (ADVIL,MOTRIN) 200 MG tablet Take 800 mg by mouth as needed. For pain        No current facility-administered medications on file prior to visit.   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 Skin warm and dry. Oral mucosa is moist.   . Sclera anicteric, conjunctivae is pink. Thyroid not enlarged. No cervical lymphadenopathy. Lungs clear. Heart regular rate and rhythm.  Abdomen is soft. Bowel sounds are positive. No hepatomegaly. No abdominal masses felt. No tenderness.  No edema to lower extremities. Stool brown and guaiac negative.      Assessment:      Viral gastroenteritis resolved. Hx of perforated diverticulum with partial colectomy. Loose stools. No recent antibiotics.      Plan:    OV in 1 yr. Increase fiber in diet for your loose stools.

## 2013-06-14 NOTE — Patient Instructions (Addendum)
OV in 1 yr. Increase fiber in diet.

## 2013-11-23 ENCOUNTER — Other Ambulatory Visit: Payer: Self-pay | Admitting: Women's Health

## 2013-11-23 DIAGNOSIS — Z09 Encounter for follow-up examination after completed treatment for conditions other than malignant neoplasm: Secondary | ICD-10-CM

## 2013-12-20 ENCOUNTER — Ambulatory Visit (HOSPITAL_COMMUNITY)
Admission: RE | Admit: 2013-12-20 | Discharge: 2013-12-20 | Disposition: A | Discharge status: Home / Self Care | Payer: 59 | Source: Ambulatory Visit | Attending: Women's Health | Admitting: Women's Health

## 2013-12-20 DIAGNOSIS — N6489 Other specified disorders of breast: Secondary | ICD-10-CM | POA: Diagnosis not present

## 2013-12-20 DIAGNOSIS — Z09 Encounter for follow-up examination after completed treatment for conditions other than malignant neoplasm: Secondary | ICD-10-CM

## 2014-04-13 ENCOUNTER — Encounter: Payer: Self-pay | Admitting: Cardiovascular Disease

## 2014-05-17 ENCOUNTER — Encounter: Payer: Self-pay | Admitting: Women's Health

## 2014-05-17 ENCOUNTER — Ambulatory Visit (INDEPENDENT_AMBULATORY_CARE_PROVIDER_SITE_OTHER): Payer: 59 | Admitting: Women's Health

## 2014-05-17 VITALS — BP 134/92 | HR 80 | Ht 64.0 in | Wt 172.0 lb

## 2014-05-17 DIAGNOSIS — Z01419 Encounter for gynecological examination (general) (routine) without abnormal findings: Secondary | ICD-10-CM | POA: Diagnosis not present

## 2014-05-17 DIAGNOSIS — N3943 Post-void dribbling: Secondary | ICD-10-CM

## 2014-05-17 DIAGNOSIS — Z1212 Encounter for screening for malignant neoplasm of rectum: Secondary | ICD-10-CM

## 2014-05-17 DIAGNOSIS — Z1211 Encounter for screening for malignant neoplasm of colon: Secondary | ICD-10-CM

## 2014-05-17 DIAGNOSIS — F17200 Nicotine dependence, unspecified, uncomplicated: Secondary | ICD-10-CM

## 2014-05-17 LAB — HEMOCCULT GUIAC POC 1CARD (OFFICE): Fecal Occult Blood, POC: NEGATIVE

## 2014-05-17 MED ORDER — MEGESTROL ACETATE 40 MG PO TABS: ORAL_TABLET | ORAL | Status: DC

## 2014-05-17 NOTE — Patient Instructions (Signed)
Schedule your mammogram  Megace 1 daily beginning after your period ends in April until after your honeymoon

## 2014-05-17 NOTE — Progress Notes (Signed)
Patient ID: Cheryl Ballard, female   DOB: 1976/01/04, 39 y.o.   MRN: [id number] Subjective:   Cheryl Ballard is a 39 y.o. G5P0020 Caucasian female here for a routine well-woman exam.  Patient's last menstrual period was 05/12/2014.   Reports having intentionally lost 15lbs since January. Working out at [employer] on her days off.  Current complaints: getting married 07/15/14 and wants to make sure she is not on her period during wedding/honeymoon- requests megace.  PCP: none       does desire labs, not fasting today- will come back one day next week  Social History: Sexual: heterosexual Marital Status: engaged Living situation: with fiance Occupation: Therapist, sports at [employer] Tobacco/alcohol: tobacco: 1ppd- knows she needs to quit but no desire to quit currently, etoh: [id number]/FGHWE Illicit drugs: no history of illicit drug use  The following portions of the patient's history were reviewed and updated as appropriate: allergies, current medications, past family history, past medical history, past social history, past surgical history and problem list.  Past Medical History Past Medical History  Diagnosis Date  . Diverticulitis     Past Surgical History Past Surgical History  Procedure Laterality Date  . Colon surgery  04/2010    Gynecologic History G2P0020  Patient's last menstrual period was 05/12/2014. Contraception: none, not actively trying to conceive, has been told her ovaries/tubes have lots of adhesions from past abd surgeries Last Pap: 01/24/13. Results were: neg w/ -HRHPV Last mammogram: bilateral Dec 2014, Rt unilateral 12/2013. Results were: stable Rt fibroglandular pattern including dense fibroglandular tissue w/in the lateral aspect of the Rt breast- recommended short interval f/u. Due for bilateral mammo.   Obstetric History OB History  Gravida Para Term Preterm AB SAB TAB Ectopic Multiple Living  2    2 1         # Outcome Date GA Lbr Len/2nd Weight Sex Delivery Anes PTL Lv  2 AB            1 SAB               Current Medications No current outpatient prescriptions on file prior to visit.   No current facility-administered medications on file prior to visit.    Review of Systems Patient denies any headaches, blurred vision, shortness of breath, chest pain, abdominal pain, problems with bowel movements, or intercourse. She does have occ postvoiding incontinence, does Kegels when she remembers, but has to wear a panty liner.  Objective:  BP 134/92 mmHg  Pulse 80  Ht 5\' 4"  (1.626 m)  Wt 172 lb (78.019 kg)  BMI 29.51 kg/m2  LMP 05/12/2014  BP recheck: 132/82 Physical Exam  General:  Well developed, well nourished, no acute distress. She is alert and oriented x3. Skin:  Warm and dry Neck:  Midline trachea, no thyromegaly or nodules Cardiovascular: Regular rate and rhythm, no murmur heard Lungs:  Effort normal, all lung fields clear to auscultation bilaterally Breasts:  Rt: diffuse thickening upper-outer quadrant- no changes since last exam- this is the area of stable fibroglandular tissue per mammogram. Lt: diffuse thickening Lt outer-lateral breast- pt states this has been there for years and was also noted on Dec 2014 exam by radiologist. No dominant palpable mass, retraction, or nipple discharge Abdomen:  Soft, non tender, no hepatosplenomegaly or masses Pelvic:  External genitalia is normal in appearance.  The vagina is normal in appearance. The cervix is bulbous, no CMT.  Thin prep pap is not done Uterus is felt to be normal size, shape, and  contour.  No adnexal masses or tenderness noted. Rectal: Good sphincter tone, no polyps, or hemorrhoids felt.  Hemoccult: neg Extremities:  No swelling or varicosities noted Psych:  She has a normal mood and affect  Assessment:   Healthy well-woman exam Bilateral breast diffuse thickness, fibroglandular tissue per latest mammo Smoker, no desire to quit at this time Occ post-voiding incontinence Desire to be amenorrheic for  upcoming wedding/honeymoon  Plan:  Schedule screening/follow-up mammo this week Return next week for fasting labs: CBC, CMP, TSH, Lipid panel Rx megace 40mg  to take daily beginning at end of April period and until she returns from honeymoon. If begins to bleed near wedding/honeymoon can increase to 3 daily F/U 39yr for physical, or sooner if needed Colonoscopy @39yo  or sooner if problems Can try new OTC bladder supports to help decrease incontinence Continue Kegels Continue considering smoking cessation  Tawnya Crook CNM, [facility] 05/17/2014 12:48 PM

## 2014-11-14 ENCOUNTER — Other Ambulatory Visit: Payer: Self-pay | Admitting: Women's Health

## 2014-11-14 DIAGNOSIS — Z01419 Encounter for gynecological examination (general) (routine) without abnormal findings: Secondary | ICD-10-CM

## 2014-11-22 ENCOUNTER — Ambulatory Visit (INDEPENDENT_AMBULATORY_CARE_PROVIDER_SITE_OTHER): Payer: 59 | Admitting: Women's Health

## 2014-11-22 ENCOUNTER — Other Ambulatory Visit: Payer: Self-pay | Admitting: Women's Health

## 2014-11-22 ENCOUNTER — Encounter: Payer: Self-pay | Admitting: Women's Health

## 2014-11-22 VITALS — BP 128/88 | HR 72

## 2014-11-22 DIAGNOSIS — F32A Depression, unspecified: Secondary | ICD-10-CM | POA: Insufficient documentation

## 2014-11-22 DIAGNOSIS — F17200 Nicotine dependence, unspecified, uncomplicated: Secondary | ICD-10-CM

## 2014-11-22 DIAGNOSIS — N926 Irregular menstruation, unspecified: Secondary | ICD-10-CM | POA: Diagnosis not present

## 2014-11-22 DIAGNOSIS — M5430 Sciatica, unspecified side: Secondary | ICD-10-CM

## 2014-11-22 DIAGNOSIS — Z720 Tobacco use: Secondary | ICD-10-CM

## 2014-11-22 DIAGNOSIS — F329 Major depressive disorder, single episode, unspecified: Secondary | ICD-10-CM | POA: Diagnosis not present

## 2014-11-22 HISTORY — DX: Sciatica, unspecified side: M54.30

## 2014-11-22 HISTORY — DX: Irregular menstruation, unspecified: N92.6

## 2014-11-22 MED ORDER — BUPROPION HCL ER (XL) 150 MG PO TB24: ORAL_TABLET | ORAL | Status: DC

## 2014-11-22 NOTE — Progress Notes (Signed)
Patient ID: [name], female   DOB: Mar 24, 1975, 39 y.o.   MRN: [id number]   [facility] Visit  Patient name: Cheryl Ballard MRN [id number]  Date of birth: June 27, 1975  CC & HPI:  Cheryl Ballard is a 39 y.o. G35P0020 Caucasian female presenting today for report of (1) depression: just feels down, depressed, teary all the time now- lots of stress at job, marriage is going well, upset over 20lb weight gain since May. Denies SI/HI. (2) smoker- 1/2ppd, wants to quit- pt would like to try wellbutrin for depression/smoking cessation. (3) Sciatica: occ tingling down leg, no constant LBP, (4) Irregular periods: periods used to be very regular q 28 days, but for last few years have become increasing irregular. May have period then 2 weeks later start bleeding again for 10 days. Does have h/o uterine fibroid. Hasn't had u/s in a long time.      Patient's last menstrual period was 10/26/2014. The current method of family planning is none. Last pap 01/24/13 neg w/ neg HRHPV  Pertinent History Reviewed:  Medical & Surgical Hx:   Past Medical History  Diagnosis Date  . Diverticulitis    Past Surgical History  Procedure Laterality Date  . Colon surgery  04/2010   Medications: Reviewed & Updated - see associated section Social History: Reviewed -  reports that she has been smoking.  She does not have any smokeless tobacco history on file.  Objective Findings:  Vitals: BP 126/98 mmHg  Pulse 72  Wt   LMP 10/26/2014 There is no weight on file to calculate BMI.  BP recheck 128/88  Physical Examination: General appearance - alert, well appearing, and in no distress  No results found for this or any previous visit (from the past 24 hour(s)).   Assessment & Plan:  A:   Depression  Smoker- wants to quit  Sciatica  Irregular menses, h/o fibroid  P:  Rx wellbutrin 150mg  daily x 3d, then BID (for depression & smoking cessation)  Set quit date sometime during 2nd week of taking  wellbutrin  Gave printed exercises for sciatica  Will do pelvic u/s to assess fibroid  Return in about 1 month (around 12/22/2014) for US:GYN & f/u w/ me.  Tawnya Crook CNM, [facility] 11/22/2014 12:42 PM

## 2014-11-22 NOTE — Patient Instructions (Signed)
Sciatica with Rehab The sciatic nerve runs from the back down the leg and is responsible for sensation and control of the muscles in the back (posterior) side of the thigh, lower leg, and foot. Sciatica is a condition that is characterized by inflammation of this nerve.  SYMPTOMS   Signs of nerve damage, including numbness and/or weakness along the posterior side of the lower extremity.  Pain in the back of the thigh that may also travel down the leg.  Pain that worsens when sitting for long periods of time.  Occasionally, pain in the back or buttock. CAUSES  Inflammation of the sciatic nerve is the cause of sciatica. The inflammation is due to something irritating the nerve. Common sources of irritation include:  Sitting for long periods of time.  Direct trauma to the nerve.  Arthritis of the spine.  Herniated or ruptured disk.  Slipping of the vertebrae (spondylolisthesis).  Pressure from soft tissues, such as muscles or ligament-like tissue (fascia). RISK INCREASES WITH:  Sports that place pressure or stress on the spine (football or weightlifting).  Poor strength and flexibility.  Failure to warm up properly before activity.  Family history of low back pain or disk disorders.  Previous back injury or surgery.  Poor body mechanics, especially when lifting, or poor posture. PREVENTION   Warm up and stretch properly before activity.  Maintain physical fitness:  Strength, flexibility, and endurance.  Cardiovascular fitness.  Learn and use proper technique, especially with posture and lifting. When possible, have coach correct improper technique.  Avoid activities that place stress on the spine. PROGNOSIS If treated properly, then sciatica usually resolves within 6 weeks. However, occasionally surgery is necessary.  RELATED COMPLICATIONS   Permanent nerve damage, including pain, numbness, tingle, or weakness.  Chronic back pain.  Risks of surgery: infection,  bleeding, nerve damage, or damage to surrounding tissues. TREATMENT Treatment initially involves resting from any activities that aggravate your symptoms. The use of ice and medication may help reduce pain and inflammation. The use of strengthening and stretching exercises may help reduce pain with activity. These exercises may be performed at home or with referral to a therapist. A therapist may recommend further treatments, such as transcutaneous electronic nerve stimulation (TENS) or ultrasound. Your caregiver may recommend corticosteroid injections to help reduce inflammation of the sciatic nerve. If symptoms persist despite non-surgical (conservative) treatment, then surgery may be recommended. MEDICATION  If pain medication is necessary, then nonsteroidal anti-inflammatory medications, such as aspirin and ibuprofen, or other minor pain relievers, such as acetaminophen, are often recommended.  Do not take pain medication for 7 days before surgery.  Prescription pain relievers may be given if deemed necessary by your caregiver. Use only as directed and only as much as you need.  Ointments applied to the skin may be helpful.  Corticosteroid injections may be given by your caregiver. These injections should be reserved for the most serious cases, because they may only be given a certain number of times. HEAT AND COLD  Cold treatment (icing) relieves pain and reduces inflammation. Cold treatment should be applied for 10 to 15 minutes every 2 to 3 hours for inflammation and pain and immediately after any activity that aggravates your symptoms. Use ice packs or massage the area with a piece of ice (ice massage).  Heat treatment may be used prior to performing the stretching and strengthening activities prescribed by your caregiver, physical therapist, or athletic trainer. Use a heat pack or soak the injury in warm water.   SEEK MEDICAL CARE IF:  Treatment seems to offer no benefit, or the condition  worsens.  Any medications produce adverse side effects. EXERCISES  RANGE OF MOTION (ROM) AND STRETCHING EXERCISES - Sciatica Most people with sciatic will find that their symptoms worsen with either excessive bending forward (flexion) or arching at the low back (extension). The exercises which will help resolve your symptoms will focus on the opposite motion. Your physician, physical therapist or athletic trainer will help you determine which exercises will be most helpful to resolve your low back pain. Do not complete any exercises without first consulting with your clinician. Discontinue any exercises which worsen your symptoms until you speak to your clinician. If you have pain, numbness or tingling which travels down into your buttocks, leg or foot, the goal of the therapy is for these symptoms to move closer to your back and eventually resolve. Occasionally, these leg symptoms will get better, but your low back pain may worsen; this is typically an indication of progress in your rehabilitation. Be certain to be very alert to any changes in your symptoms and the activities in which you participated in the 24 hours prior to the change. Sharing this information with your clinician will allow him/her to most efficiently treat your condition. These exercises may help you when beginning to rehabilitate your injury. Your symptoms may resolve with or without further involvement from your physician, physical therapist or athletic trainer. While completing these exercises, remember:   Restoring tissue flexibility helps normal motion to return to the joints. This allows healthier, less painful movement and activity.  An effective stretch should be held for at least 30 seconds.  A stretch should never be painful. You should only feel a gentle lengthening or release in the stretched tissue. FLEXION RANGE OF MOTION AND STRETCHING EXERCISES: STRETCH - Flexion, Single Knee to Chest   Lie on a firm bed or floor  with both legs extended in front of you.  Keeping one leg in contact with the floor, bring your opposite knee to your chest. Hold your leg in place by either grabbing behind your thigh or at your knee.  Pull until you feel a gentle stretch in your low back. Hold __________ seconds.  Slowly release your grasp and repeat the exercise with the opposite side. Repeat __________ times. Complete this exercise __________ times per day.  STRETCH - Flexion, Double Knee to Chest  Lie on a firm bed or floor with both legs extended in front of you.  Keeping one leg in contact with the floor, bring your opposite knee to your chest.  Tense your stomach muscles to support your back and then lift your other knee to your chest. Hold your legs in place by either grabbing behind your thighs or at your knees.  Pull both knees toward your chest until you feel a gentle stretch in your low back. Hold __________ seconds.  Tense your stomach muscles and slowly return one leg at a time to the floor. Repeat __________ times. Complete this exercise __________ times per day.  STRETCH - Low Trunk Rotation   Lie on a firm bed or floor. Keeping your legs in front of you, bend your knees so they are both pointed toward the ceiling and your feet are flat on the floor.  Extend your arms out to the side. This will stabilize your upper body by keeping your shoulders in contact with the floor.  Gently and slowly drop both knees together to one side until   you feel a gentle stretch in your low back. Hold for __________ seconds.  Tense your stomach muscles to support your low back as you bring your knees back to the starting position. Repeat the exercise to the other side. Repeat __________ times. Complete this exercise __________ times per day  EXTENSION RANGE OF MOTION AND FLEXIBILITY EXERCISES: STRETCH - Extension, Prone on Elbows  Lie on your stomach on the floor, a bed will be too soft. Place your palms about shoulder  width apart and at the height of your head.  Place your elbows under your shoulders. If this is too painful, stack pillows under your chest.  Allow your body to relax so that your hips drop lower and make contact more completely with the floor.  Hold this position for __________ seconds.  Slowly return to lying flat on the floor. Repeat __________ times. Complete this exercise __________ times per day.  RANGE OF MOTION - Extension, Prone Press Ups  Lie on your stomach on the floor, a bed will be too soft. Place your palms about shoulder width apart and at the height of your head.  Keeping your back as relaxed as possible, slowly straighten your elbows while keeping your hips on the floor. You may adjust the placement of your hands to maximize your comfort. As you gain motion, your hands will come more underneath your shoulders.  Hold this position __________ seconds.  Slowly return to lying flat on the floor. Repeat __________ times. Complete this exercise __________ times per day.  STRENGTHENING EXERCISES - Sciatica  These exercises may help you when beginning to rehabilitate your injury. These exercises should be done near your "sweet spot." This is the neutral, low-back arch, somewhere between fully rounded and fully arched, that is your least painful position. When performed in this safe range of motion, these exercises can be used for people who have either a flexion or extension based injury. These exercises may resolve your symptoms with or without further involvement from your physician, physical therapist or athletic trainer. While completing these exercises, remember:   Muscles can gain both the endurance and the strength needed for everyday activities through controlled exercises.  Complete these exercises as instructed by your physician, physical therapist or athletic trainer. Progress with the resistance and repetition exercises only as your caregiver advises.  You may  experience muscle soreness or fatigue, but the pain or discomfort you are trying to eliminate should never worsen during these exercises. If this pain does worsen, stop and make certain you are following the directions exactly. If the pain is still present after adjustments, discontinue the exercise until you can discuss the trouble with your clinician. STRENGTHENING - Deep Abdominals, Pelvic Tilt   Lie on a firm bed or floor. Keeping your legs in front of you, bend your knees so they are both pointed toward the ceiling and your feet are flat on the floor.  Tense your lower abdominal muscles to press your low back into the floor. This motion will rotate your pelvis so that your tail bone is scooping upwards rather than pointing at your feet or into the floor.  With a gentle tension and even breathing, hold this position for __________ seconds. Repeat __________ times. Complete this exercise __________ times per day.  STRENGTHENING - Abdominals, Crunches   Lie on a firm bed or floor. Keeping your legs in front of you, bend your knees so they are both pointed toward the ceiling and your feet are flat on the   floor. Cross your arms over your chest.  Slightly tip your chin down without bending your neck.  Tense your abdominals and slowly lift your trunk high enough to just clear your shoulder blades. Lifting higher can put excessive stress on the low back and does not further strengthen your abdominal muscles.  Control your return to the starting position. Repeat __________ times. Complete this exercise __________ times per day.  STRENGTHENING - Quadruped, Opposite UE/LE Lift  Assume a hands and knees position on a firm surface. Keep your hands under your shoulders and your knees under your hips. You may place padding under your knees for comfort.  Find your neutral spine and gently tense your abdominal muscles so that you can maintain this position. Your shoulders and hips should form a rectangle  that is parallel with the floor and is not twisted.  Keeping your trunk steady, lift your right hand no higher than your shoulder and then your left leg no higher than your hip. Make sure you are not holding your breath. Hold this position __________ seconds.  Continuing to keep your abdominal muscles tense and your back steady, slowly return to your starting position. Repeat with the opposite arm and leg. Repeat __________ times. Complete this exercise __________ times per day.  STRENGTHENING - Abdominals and Quadriceps, Straight Leg Raise   Lie on a firm bed or floor with both legs extended in front of you.  Keeping one leg in contact with the floor, bend the other knee so that your foot can rest flat on the floor.  Find your neutral spine, and tense your abdominal muscles to maintain your spinal position throughout the exercise.  Slowly lift your straight leg off the floor about 6 inches for a count of 15, making sure to not hold your breath.  Still keeping your neutral spine, slowly lower your leg all the way to the floor. Repeat this exercise with each leg __________ times. Complete this exercise __________ times per day. POSTURE AND BODY MECHANICS CONSIDERATIONS - Sciatica Keeping correct posture when sitting, standing or completing your activities will reduce the stress put on different body tissues, allowing injured tissues a chance to heal and limiting painful experiences. The following are general guidelines for improved posture. Your physician or physical therapist will provide you with any instructions specific to your needs. While reading these guidelines, remember:  The exercises prescribed by your Ja Ohman will help you have the flexibility and strength to maintain correct postures.  The correct posture provides the optimal environment for your joints to work. All of your joints have less wear and tear when properly supported by a spine with good posture. This means you will  experience a healthier, less painful body.  Correct posture must be practiced with all of your activities, especially prolonged sitting and standing. Correct posture is as important when doing repetitive low-stress activities (typing) as it is when doing a single heavy-load activity (lifting). RESTING POSITIONS Consider which positions are most painful for you when choosing a resting position. If you have pain with flexion-based activities (sitting, bending, stooping, squatting), choose a position that allows you to rest in a less flexed posture. You would want to avoid curling into a fetal position on your side. If your pain worsens with extension-based activities (prolonged standing, working overhead), avoid resting in an extended position such as sleeping on your stomach. Most people will find more comfort when they rest with their spine in a more neutral position, neither too rounded nor too   arched. Lying on a non-sagging bed on your side with a pillow between your knees, or on your back with a pillow under your knees will often provide some relief. Keep in mind, being in any one position for a prolonged period of time, no matter how correct your posture, can still lead to stiffness. PROPER SITTING POSTURE In order to minimize stress and discomfort on your spine, you must sit with correct posture Sitting with good posture should be effortless for a healthy body. Returning to good posture is a gradual process. Many people can work toward this most comfortably by using various supports until they have the flexibility and strength to maintain this posture on their own. When sitting with proper posture, your ears will fall over your shoulders and your shoulders will fall over your hips. You should use the back of the chair to support your upper back. Your low back will be in a neutral position, just slightly arched. You may place a small pillow or folded towel at the base of your low back for support.  When  working at a desk, create an environment that supports good, upright posture. Without extra support, muscles fatigue and lead to excessive strain on joints and other tissues. Keep these recommendations in mind: CHAIR:   A chair should be able to slide under your desk when your back makes contact with the back of the chair. This allows you to work closely.  The chair's height should allow your eyes to be level with the upper part of your monitor and your hands to be slightly lower than your elbows. BODY POSITION  Your feet should make contact with the floor. If this is not possible, use a foot rest.  Keep your ears over your shoulders. This will reduce stress on your neck and low back. INCORRECT SITTING POSTURES   If you are feeling tired and unable to assume a healthy sitting posture, do not slouch or slump. This puts excessive strain on your back tissues, causing more damage and pain. Healthier options include:  Using more support, like a lumbar pillow.  Switching tasks to something that requires you to be upright or walking.  Talking a brief walk.  Lying down to rest in a neutral-spine position. PROLONGED STANDING WHILE SLIGHTLY LEANING FORWARD  When completing a task that requires you to lean forward while standing in one place for a long time, place either foot up on a stationary 2-4 inch high object to help maintain the best posture. When both feet are on the ground, the low back tends to lose its slight inward curve. If this curve flattens (or becomes too large), then the back and your other joints will experience too much stress, fatigue more quickly and can cause pain.  CORRECT STANDING POSTURES Proper standing posture should be assumed with all daily activities, even if they only take a few moments, like when brushing your teeth. As in sitting, your ears should fall over your shoulders and your shoulders should fall over your hips. You should keep a slight tension in your abdominal  muscles to brace your spine. Your tailbone should point down to the ground, not behind your body, resulting in an over-extended swayback posture.  INCORRECT STANDING POSTURES  Common incorrect standing postures include a forward head, locked knees and/or an excessive swayback. WALKING Walk with an upright posture. Your ears, shoulders and hips should all line-up. PROLONGED ACTIVITY IN A FLEXED POSITION When completing a task that requires you to bend forward   at your waist or lean over a low surface, try to find a way to stabilize 3 of 4 of your limbs. You can place a hand or elbow on your thigh or rest a knee on the surface you are reaching across. This will provide you more stability so that your muscles do not fatigue as quickly. By keeping your knees relaxed, or slightly bent, you will also reduce stress across your low back. CORRECT LIFTING TECHNIQUES DO :   Assume a wide stance. This will provide you more stability and the opportunity to get as close as possible to the object which you are lifting.  Tense your abdominals to brace your spine; then bend at the knees and hips. Keeping your back locked in a neutral-spine position, lift using your leg muscles. Lift with your legs, keeping your back straight.  Test the weight of unknown objects before attempting to lift them.  Try to keep your elbows locked down at your sides in order get the best strength from your shoulders when carrying an object.  Always ask for help when lifting heavy or awkward objects. INCORRECT LIFTING TECHNIQUES DO NOT:   Lock your knees when lifting, even if it is a small object.  Bend and twist. Pivot at your feet or move your feet when needing to change directions.  Assume that you cannot safely pick up a paperclip without proper posture. Document Released: 02/17/2005 Document Revised: 07/04/2013 Document Reviewed: 06/01/2008 [employer] Patient Information 2015 [employer]. This information is not intended to  replace advice given to you by your health care Cheryl Ballard. Make sure you discuss any questions you have with your health care Cheryl Ballard.  

## 2014-11-23 ENCOUNTER — Telehealth: Payer: Self-pay | Admitting: *Deleted

## 2014-11-23 LAB — COMPREHENSIVE METABOLIC PANEL
ALBUMIN: 4.8 g/dL (ref 3.5–5.5)
ALT: 17 IU/L (ref 0–32)
AST: 16 IU/L (ref 0–40)
Albumin/Globulin Ratio: 1.7 (ref 1.1–2.5)
Alkaline Phosphatase: 59 IU/L (ref 39–117)
BUN / CREAT RATIO: 13 (ref 9–23)
BUN: 12 mg/dL (ref 6–24)
Bilirubin Total: 0.7 mg/dL (ref 0.0–1.2)
CALCIUM: 9.9 mg/dL (ref 8.7–10.2)
CO2: 21 mmol/L (ref 18–29)
CREATININE: 0.92 mg/dL (ref 0.57–1.00)
Chloride: 100 mmol/L (ref 97–108)
GFR, EST AFRICAN AMERICAN: 89 mL/min/{1.73_m2} (ref >59)
GFR, EST NON AFRICAN AMERICAN: 78 mL/min/{1.73_m2} (ref >59)
GLOBULIN, TOTAL: 2.8 g/dL (ref 1.5–4.5)
Glucose: 87 mg/dL (ref 65–99)
Potassium: 4.2 mmol/L (ref 3.5–5.2)
SODIUM: 139 mmol/L (ref 134–144)
TOTAL PROTEIN: 7.6 g/dL (ref 6.0–8.5)

## 2014-11-23 LAB — CBC
HEMATOCRIT: 46.7 % — AB (ref 34.0–46.6)
Hemoglobin: 15.9 g/dL (ref 11.1–15.9)
MCH: 31.2 pg (ref 26.6–33.0)
MCHC: 34 g/dL (ref 31.5–35.7)
MCV: 92 fL (ref 79–97)
Platelets: 270 10*3/uL (ref 150–379)
RBC: 5.09 x10E6/uL (ref 3.77–5.28)
RDW: 13 % (ref 12.3–15.4)
WBC: 9.1 10*3/uL (ref 3.4–10.8)

## 2014-11-23 LAB — LIPID PANEL
CHOL/HDL RATIO: 2.7 ratio (ref 0.0–4.4)
Cholesterol, Total: 182 mg/dL (ref 100–199)
HDL: 68 mg/dL (ref >39)
LDL Calculated: 97 mg/dL (ref 0–99)
TRIGLYCERIDES: 86 mg/dL (ref 0–149)
VLDL CHOLESTEROL CAL: 17 mg/dL (ref 5–40)

## 2014-11-23 LAB — TSH: TSH: 1.58 u[IU]/mL (ref 0.450–4.500)

## 2014-11-23 NOTE — Telephone Encounter (Signed)
Cheryl Ballard from [facility] called stating did Cheryl Ballard want to be prescribed the Wellbutrin XL or XR? Informed Cheryl Ballard not in office today will route message back in the office tomorrow.

## 2014-11-24 MED ORDER — BUPROPION HCL ER (SR) 150 MG PO TB12: ORAL_TABLET | ORAL | Status: DC

## 2014-11-24 NOTE — Addendum Note (Signed)
Addended by: Wells Guiles R on: 11/24/2014 11:45 AM   Modules accepted: Orders

## 2014-12-20 ENCOUNTER — Ambulatory Visit: Payer: 59 | Admitting: Women's Health

## 2014-12-20 ENCOUNTER — Other Ambulatory Visit: Payer: 59

## 2014-12-21 ENCOUNTER — Other Ambulatory Visit: Payer: 59

## 2014-12-21 ENCOUNTER — Ambulatory Visit: Payer: 59 | Admitting: Advanced Practice Midwife

## 2015-10-12 ENCOUNTER — Telehealth: Payer: Self-pay | Admitting: Advanced Practice Midwife

## 2015-10-12 ENCOUNTER — Other Ambulatory Visit: Payer: Self-pay | Admitting: Advanced Practice Midwife

## 2015-10-12 ENCOUNTER — Other Ambulatory Visit: Payer: Self-pay | Admitting: Obstetrics & Gynecology

## 2015-10-12 DIAGNOSIS — N6489 Other specified disorders of breast: Secondary | ICD-10-CM

## 2015-10-12 NOTE — Progress Notes (Unsigned)
3D mammogram/possible [place] ordered and scheduled for 8/29 @ 0840 (didn't realize that she was supposed to have f/u 12/15).

## 2015-10-16 NOTE — Telephone Encounter (Signed)
mammogrem ordered

## 2015-10-24 ENCOUNTER — Other Ambulatory Visit: Payer: Self-pay | Admitting: Advanced Practice Midwife

## 2015-10-24 DIAGNOSIS — N6489 Other specified disorders of breast: Secondary | ICD-10-CM

## 2015-10-30 ENCOUNTER — Ambulatory Visit (HOSPITAL_COMMUNITY)
Admission: RE | Admit: 2015-10-30 | Discharge: 2015-10-30 | Disposition: A | Discharge status: Home / Self Care | Payer: 59 | Source: Ambulatory Visit | Attending: Advanced Practice Midwife | Admitting: Advanced Practice Midwife

## 2015-10-30 DIAGNOSIS — N6489 Other specified disorders of breast: Secondary | ICD-10-CM | POA: Insufficient documentation

## 2015-10-30 DIAGNOSIS — R928 Other abnormal and inconclusive findings on diagnostic imaging of breast: Secondary | ICD-10-CM | POA: Diagnosis not present

## 2015-11-08 ENCOUNTER — Encounter (INDEPENDENT_AMBULATORY_CARE_PROVIDER_SITE_OTHER): Payer: Self-pay | Admitting: Internal Medicine

## 2016-01-14 DIAGNOSIS — M545 Low back pain: Secondary | ICD-10-CM | POA: Diagnosis not present

## 2016-03-11 ENCOUNTER — Telehealth: Payer: 59 | Admitting: Nurse Practitioner

## 2016-03-11 DIAGNOSIS — R05 Cough: Secondary | ICD-10-CM

## 2016-03-11 DIAGNOSIS — R059 Cough, unspecified: Secondary | ICD-10-CM

## 2016-03-11 MED ORDER — AZITHROMYCIN 250 MG PO TABS: ORAL_TABLET | ORAL | 0 refills | Status: DC

## 2016-03-11 NOTE — Progress Notes (Signed)
We are sorry that you are not feeling well.  Here is how we plan to help!  Based on what you have shared with me it looks like you have upper respiratory tract inflammation that has resulted in a significant cough.  Inflammation and infection in the upper respiratory tract is commonly called bronchitis and has four common causes:  Allergies, Viral Infections, Acid Reflux and Bacterial Infections.  Allergies, viruses and acid reflux are treated by controlling symptoms or eliminating the cause. An example might be a cough caused by taking certain blood pressure medications. You stop the cough by changing the medication. Another example might be a cough caused by acid reflux. Controlling the reflux helps control the cough.  Based on your presentation I believe you most likely have A cough due to bacteria.  When patients have a fever and a productive cough with a change in color or increased sputum production, we are concerned about bacterial bronchitis.  If left untreated it can progress to pneumonia.  If your symptoms do not improve with your treatment plan it is important that you contact your provider.   I have prescribed Azithromyin 250 mg: two tables now and then one tablet daily for 4 additonal days    In addition you may use A non-prescription cough medication called Robitussin DAC. Take 2 teaspoons every 8 hours or Delsym: take 2 teaspoons every 12 hours.    USE OF BRONCHODILATOR ("RESCUE") INHALERS: There is a risk from using your bronchodilator too frequently.  The risk is that over-reliance on a medication which only relaxes the muscles surrounding the breathing tubes can reduce the effectiveness of medications prescribed to reduce swelling and congestion of the tubes themselves.  Although you feel brief relief from the bronchodilator inhaler, your asthma may actually be worsening with the tubes becoming more swollen and filled with mucus.  This can delay other crucial treatments, such as oral  steroid medications. If you need to use a bronchodilator inhaler daily, several times per day, you should discuss this with your provider.  There are probably better treatments that could be used to keep your asthma under control.     HOME CARE . Only take medications as instructed by your medical team. . Complete the entire course of an antibiotic. . Drink plenty of fluids and get plenty of rest. . Avoid close contacts especially the very young and the elderly . Cover your mouth if you cough or cough into your sleeve. . Always remember to wash your hands . A steam or ultrasonic humidifier can help congestion.   GET HELP RIGHT AWAY IF: . You develop worsening fever. . You become short of breath . You cough up blood. . Your symptoms persist after you have completed your treatment plan MAKE SURE YOU   Understand these instructions.  Will watch your condition.  Will get help right away if you are not doing well or get worse.  Your e-visit answers were reviewed by a board certified advanced clinical practitioner to complete your personal care plan.  Depending on the condition, your plan could have included both over the counter or prescription medications. If there is a problem please reply  once you have received a response from your provider. Your safety is important to us.  If you have drug allergies check your prescription carefully.    You can use MyChart to ask questions about today's visit, request a non-urgent call back, or ask for a work or school excuse for 24 hours   related to this e-Visit. If it has been greater than 24 hours you will need to follow up with your provider, or enter a new e-Visit to address those concerns. You will get an e-mail in the next two days asking about your experience.  I hope that your e-visit has been valuable and will speed your recovery. Thank you for using e-visits.   

## 2016-03-11 NOTE — Progress Notes (Signed)
We are sorry that you are not feeling well.  Here is how we plan to help!  Based on what you have shared with me it looks like you have upper respiratory tract inflammation that has resulted in a significant cough.  Inflammation and infection in the upper respiratory tract is commonly called bronchitis and has four common causes:  Allergies, Viral Infections, Acid Reflux and Bacterial Infections.  Allergies, viruses and acid reflux are treated by controlling symptoms or eliminating the cause. An example might be a cough caused by taking certain blood pressure medications. You stop the cough by changing the medication. Another example might be a cough caused by acid reflux. Controlling the reflux helps control the cough.  Based on your presentation I believe you most likely have You had a zpak called in yesterday.    In addition you may use A non-prescription cough medication called Robitussin DAC. Take 2 teaspoons every 8 hours or Delsym: take 2 teaspoons every 12 hours.    USE OF BRONCHODILATOR ("RESCUE") INHALERS: There is a risk from using your bronchodilator too frequently.  The risk is that over-reliance on a medication which only relaxes the muscles surrounding the breathing tubes can reduce the effectiveness of medications prescribed to reduce swelling and congestion of the tubes themselves.  Although you feel brief relief from the bronchodilator inhaler, your asthma may actually be worsening with the tubes becoming more swollen and filled with mucus.  This can delay other crucial treatments, such as oral steroid medications. If you need to use a bronchodilator inhaler daily, several times per day, you should discuss this with your provider.  There are probably better treatments that could be used to keep your asthma under control.     HOME CARE . Only take medications as instructed by your medical team. . Complete the entire course of an antibiotic. . Drink plenty of fluids and get plenty of  rest. . Avoid close contacts especially the very young and the elderly . Cover your mouth if you cough or cough into your sleeve. . Always remember to wash your hands . A steam or ultrasonic humidifier can help congestion.   GET HELP RIGHT AWAY IF: . You develop worsening fever. . You become short of breath . You cough up blood. . Your symptoms persist after you have completed your treatment plan MAKE SURE YOU   Understand these instructions.  Will watch your condition.  Will get help right away if you are not doing well or get worse.  Your e-visit answers were reviewed by a board certified advanced clinical practitioner to complete your personal care plan.  Depending on the condition, your plan could have included both over the counter or prescription medications. If there is a problem please reply  once you have received a response from your provider. Your safety is important to [place].  If you have drug allergies check your prescription carefully.    You can use MyChart to ask questions about today's visit, request a non-urgent call back, or ask for a work or school excuse for 24 hours related to this e-Visit. If it has been greater than 24 hours you will need to follow up with your provider, or enter a new e-Visit to address those concerns. You will get an e-mail in the next two days asking about your experience.  I hope that your e-visit has been valuable and will speed your recovery. Thank you for using e-visits.

## 2016-03-13 ENCOUNTER — Telehealth: Payer: 59 | Admitting: Physician Assistant

## 2016-03-13 DIAGNOSIS — J40 Bronchitis, not specified as acute or chronic: Secondary | ICD-10-CM

## 2016-03-13 NOTE — Progress Notes (Signed)
Based on what you shared with me it looks like you have a condition that should be evaluated in a face to face office visit. It looks look you did am e-visit 2 days ago for similar symptoms. The provider felt symptoms were more consistent with a bronchitis and started you on a Z-pack. If symptoms are worsening on medication, you need evaluation in office to make further diagnosis and treatment. I would encourage you to hydrate well. Tylenol if needed for fever. Continue antibiotic and schedule an appointment with your primary care provider.     NOTE: Even if you have entered your credit card information for this eVisit, you will not be charged.   If you are having a true medical emergency please call 911.  If you need an urgent face to face visit, [facility] has four urgent care centers for your convenience.  If you need care fast and have a high deductible or no insurance consider:   [email or web address]  [phone]  [address] [place], [place] [address] 8 am to 8 pm Monday-Friday 10 am to 4 pm Saturday-Sunday   The following sites will take your  insurance:    . [facility] [place] a Provider at this Location  [address] [place], [place] [address] . 10 am to 8 pm Monday-Friday . 12 pm to 8 pm Saturday-Sunday   . [facility] [place] a Provider at this Location  [address] [place], [place], [place] [address] . 8 am to 8 pm Monday-Friday . 9 am to 6 pm Saturday . 11 am to 6 pm Sunday   . [facility] [place] Get Driving Directions  [id number] [place], [place] [address] . 8 am to 8 pm Monday-Friday . 8 am to 4 pm Saturday-Sunday   . [facility] (walk-ins welcome, or call for a scheduled time)  [phone]  Get Driving Directions  Find a Provider at this Location  [place], [place] [address] . 8 am to 8:30 pm Monday-Thursday . 8 am to 6 pm Friday . 8 am to 4 pm Saturday-Sunday   Your e-visit answers were reviewed by a board certified advanced clinical practitioner to complete your personal care plan.  Thank you for using e-Visits.

## 2016-03-28 ENCOUNTER — Other Ambulatory Visit: Payer: 59 | Admitting: Women's Health

## 2016-04-09 ENCOUNTER — Ambulatory Visit (INDEPENDENT_AMBULATORY_CARE_PROVIDER_SITE_OTHER): Payer: 59 | Admitting: Women's Health

## 2016-04-09 ENCOUNTER — Other Ambulatory Visit (HOSPITAL_COMMUNITY)
Admission: RE | Admit: 2016-04-09 | Discharge: 2016-04-09 | Disposition: A | Discharge status: Home / Self Care | Payer: 59 | Source: Ambulatory Visit | Attending: Obstetrics & Gynecology | Admitting: Obstetrics & Gynecology

## 2016-04-09 ENCOUNTER — Encounter: Payer: Self-pay | Admitting: Women's Health

## 2016-04-09 VITALS — BP 132/88 | HR 88 | Ht 64.5 in

## 2016-04-09 DIAGNOSIS — N946 Dysmenorrhea, unspecified: Secondary | ICD-10-CM

## 2016-04-09 DIAGNOSIS — Z01419 Encounter for gynecological examination (general) (routine) without abnormal findings: Secondary | ICD-10-CM | POA: Insufficient documentation

## 2016-04-09 DIAGNOSIS — Z1151 Encounter for screening for human papillomavirus (HPV): Secondary | ICD-10-CM | POA: Diagnosis not present

## 2016-04-09 DIAGNOSIS — Z86018 Personal history of other benign neoplasm: Secondary | ICD-10-CM

## 2016-04-09 DIAGNOSIS — N920 Excessive and frequent menstruation with regular cycle: Secondary | ICD-10-CM

## 2016-04-09 DIAGNOSIS — Z01411 Encounter for gynecological examination (general) (routine) with abnormal findings: Secondary | ICD-10-CM

## 2016-04-09 MED ORDER — CITALOPRAM HYDROBROMIDE 20 MG PO TABS: 20.0000 mg | ORAL_TABLET | Freq: Every day | ORAL | 6 refills | Status: DC

## 2016-04-09 NOTE — Patient Instructions (Signed)
Call when period starts to schedule pelvic ultrasound for 7 days later and follow up with me after ultrasound

## 2016-04-09 NOTE — Progress Notes (Signed)
Subjective:   Cheryl Ballard is a 41 y.o. G71P0020 Caucasian female here for a routine well-woman exam.  Patient's last menstrual period was 03/31/2016.    Current complaints: feels depressed, tried wellbutrin for depression/smoking cessation in 2016 and didn't help for either, wants to try something different- also reports some anxiety. Denies SI/HI.  30lb wt gain since getting married, knows she's not eating/exercising like she should.  Periods q 24-28d lasting 5d where they used to only last for 3d, has to change super pad q 2hrs, some clots, +cramping. H/O fibroid, never returned for pelvic u/s ordered in 2016, does think she wants to go ahead w/ this now.  PCP: Dr. 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  Past Medical History Past Medical History:  Diagnosis Date  . Diverticulitis   . S/P colostomy ([place])    had colostomy with surgery, but has been reversed    Past Surgical History Past Surgical History:  Procedure Laterality Date  . BOWEL RESECTION    . COLON SURGERY  04/2010  . Colostomy in April     reversal in April 2013    Gynecologic History G2P0020  Patient's last menstrual period was 03/31/2016. Contraception: none Last Pap: 2014. Results were: normal w/ -HRHPV Last mammogram: Aug 2017. Results were: normal, stable fibroglandular tissue/heterogenously dense tissue Last TCS: never  Obstetric History OB History  Gravida Para  Term Preterm AB Living  2 0 0 0 2    SAB TAB Ectopic Multiple Live Births  1 0 0        # Outcome Date GA Lbr Len/2nd Weight Sex Delivery Anes PTL Lv  2 AB           1 SAB               Current Medications Current Outpatient Prescriptions on File Prior to Visit  Medication Sig Dispense Refill  . azithromycin (ZITHROMAX Z-PAK) 250 MG tablet As directed (Patient not taking: Reported on 04/09/2016) 6 tablet 0  . buPROPion (WELLBUTRIN SR) 150 MG 12 hr tablet 150mg  po daily x 3 days, then 150mg  BID (Patient not taking: Reported on 04/09/2016) 60 tablet 6  . ibuprofen (ADVIL,MOTRIN) 200 MG tablet Take 800 mg by mouth as needed. For pain     . megestrol (MEGACE) 40 MG tablet Take one daily (Patient not taking: Reported on 11/22/2014) 45 tablet 0   No current facility-administered medications on file prior to visit.     Review of Systems Patient denies any headaches, blurred vision, shortness of breath, chest pain, abdominal pain, problems with bowel movements, urination, or intercourse.  Objective:  BP 132/88 (BP Location: Right Arm, Patient Position: Sitting, Cuff Size: Normal)   Pulse 88   Ht 5' 4.5" (1.638 m)   LMP 03/31/2016  Physical Exam  General:  Well developed, well nourished, no acute distress. She is alert and oriented x3. Skin:  Warm and dry Neck:  Midline trachea, no thyromegaly or nodules Cardiovascular: Regular rate and  rhythm, no murmur heard Lungs:  Effort normal, all lung fields clear to auscultation bilaterally Breasts:  Rt: diffuse thickening upper-outer quadrant- no changes since last exam  Lt: diffuse thickening Lt outer-lateral breast- no changes since last exam  No dominant palpable mass, retraction, or nipple discharge Abdomen:  Soft, non tender, no hepatosplenomegaly or masses Pelvic:  External genitalia is normal in appearance.  The vagina is normal in appearance. The cervix is bulbous, no CMT.  Thin prep pap is done w/ HR HPV cotesting. Uterus is felt to be  normal size, shape, and contour.  No adnexal masses or tenderness noted. Extremities:  No swelling or varicosities noted Psych:  She has a normal mood and affect  Assessment:   Healthy well-woman exam Smoker Menorrhagia w/ dysmenorrhea H/O uterine fibroid Depression/anxiety  Plan:  Rx celexa 20mg  daily w/ 6RF, give few weeks to start noticing improvement Return one day this week for fasting lipid panel, cbc, cmp, tsh, a1c Call when next period starts to schedule u/s for 1 week later when period is off, then f/u w/ me Advised smoking cessation-  Is interested in quitting- can try otc smoking cessation patches/gums  Mammogram August, or sooner if problems Colonoscopy @41yo , or per GI, or sooner if problems  Tawnya Crook CNM, [facility] 04/09/2016 2:51 PM

## 2016-04-11 LAB — CYTOLOGY - PAP
Adequacy: ABSENT
Diagnosis: NEGATIVE
HPV (WINDOPATH): NOT DETECTED

## 2016-04-24 DIAGNOSIS — Z01419 Encounter for gynecological examination (general) (routine) without abnormal findings: Secondary | ICD-10-CM | POA: Diagnosis not present

## 2016-04-25 LAB — CBC
HEMATOCRIT: 42.1 % (ref 34.0–46.6)
Hemoglobin: 14 g/dL (ref 11.1–15.9)
MCH: 30.4 pg (ref 26.6–33.0)
MCHC: 33.3 g/dL (ref 31.5–35.7)
MCV: 92 fL (ref 79–97)
PLATELETS: 277 10*3/uL (ref 150–379)
RBC: 4.6 x10E6/uL (ref 3.77–5.28)
RDW: 13.5 % (ref 12.3–15.4)
WBC: 9.1 10*3/uL (ref 3.4–10.8)

## 2016-04-25 LAB — LIPID PANEL
CHOLESTEROL TOTAL: 150 mg/dL (ref 100–199)
Chol/HDL Ratio: 2.5 (ref 0.0–4.4)
HDL: 61 mg/dL (ref >39)
LDL CALC: 70 (ref 0–99)
TRIGLYCERIDES: 94 mg/dL (ref 0–149)
VLDL CHOLESTEROL CAL: 19 (ref 5–40)

## 2016-04-25 LAB — HEMOGLOBIN A1C
ESTIMATED AVERAGE GLUCOSE: 94
Hgb A1c MFr Bld: 4.9 % (ref 4.8–5.6)

## 2016-04-25 LAB — COMPREHENSIVE METABOLIC PANEL
ALK PHOS: 57 IU/L (ref 39–117)
ALT: 19 IU/L (ref 0–32)
AST: 17 IU/L (ref 0–40)
Albumin/Globulin Ratio: 1.5 (ref 1.2–2.2)
Albumin: 4.3 g/dL (ref 3.5–5.5)
BUN/Creatinine Ratio: 11 (ref 9–23)
BUN: 11 mg/dL (ref 6–24)
Bilirubin Total: 0.4 mg/dL (ref 0.0–1.2)
CALCIUM: 9.3 mg/dL (ref 8.7–10.2)
CO2: 22 mmol/L (ref 18–29)
CREATININE: 0.97 mg/dL (ref 0.57–1.00)
Chloride: 104 mmol/L (ref 96–106)
GFR calc Af Amer: 83 (ref >59)
GFR, EST NON AFRICAN AMERICAN: 72 (ref >59)
GLUCOSE: 83 mg/dL (ref 65–99)
Globulin, Total: 2.9 (ref 1.5–4.5)
Potassium: 4.1 mmol/L (ref 3.5–5.2)
Sodium: 143 mmol/L (ref 134–144)
Total Protein: 7.2 g/dL (ref 6.0–8.5)

## 2016-04-25 LAB — TSH: TSH: 2.27 u[IU]/mL (ref 0.450–4.500)

## 2016-04-30 ENCOUNTER — Other Ambulatory Visit (HOSPITAL_COMMUNITY): Payer: Self-pay | Admitting: Orthopedic Surgery

## 2016-04-30 DIAGNOSIS — M549 Dorsalgia, unspecified: Secondary | ICD-10-CM

## 2016-05-02 ENCOUNTER — Ambulatory Visit (HOSPITAL_COMMUNITY)
Admission: RE | Admit: 2016-05-02 | Discharge: 2016-05-02 | Disposition: A | Discharge status: Home / Self Care | Payer: 59 | Source: Ambulatory Visit | Attending: Orthopedic Surgery | Admitting: Orthopedic Surgery

## 2016-05-02 DIAGNOSIS — M5127 Other intervertebral disc displacement, lumbosacral region: Secondary | ICD-10-CM | POA: Insufficient documentation

## 2016-05-02 DIAGNOSIS — M5126 Other intervertebral disc displacement, lumbar region: Secondary | ICD-10-CM | POA: Diagnosis not present

## 2016-05-02 DIAGNOSIS — M545 Low back pain: Secondary | ICD-10-CM | POA: Diagnosis present

## 2016-05-02 DIAGNOSIS — M549 Dorsalgia, unspecified: Secondary | ICD-10-CM

## 2016-05-07 ENCOUNTER — Ambulatory Visit (HOSPITAL_COMMUNITY): Payer: 59

## 2016-05-07 ENCOUNTER — Other Ambulatory Visit: Payer: Self-pay | Admitting: Women's Health

## 2016-05-07 DIAGNOSIS — H524 Presbyopia: Secondary | ICD-10-CM | POA: Diagnosis not present

## 2016-05-07 DIAGNOSIS — I493 Ventricular premature depolarization: Secondary | ICD-10-CM

## 2016-05-07 DIAGNOSIS — Z8249 Family history of ischemic heart disease and other diseases of the circulatory system: Secondary | ICD-10-CM

## 2016-05-07 DIAGNOSIS — R030 Elevated blood-pressure reading, without diagnosis of hypertension: Secondary | ICD-10-CM

## 2016-05-07 DIAGNOSIS — H5213 Myopia, bilateral: Secondary | ICD-10-CM | POA: Diagnosis not present

## 2016-05-07 NOTE — Progress Notes (Signed)
Pt contacted me yesterday stating bp was elevated at work 140s/90s-100s and was having PVCs on monitor, no other sx. Requested referral to Dr. Claiborne Billings or Dr. Johnsie Cancel cardiologists in [place]. Also has brother who passed w/ CAD. Referral sent today.  Cheryl Ballard, CNM, [facility] 05/07/2016 8:49 AM

## 2016-05-09 DIAGNOSIS — M545 Low back pain: Secondary | ICD-10-CM | POA: Diagnosis not present

## 2016-05-20 ENCOUNTER — Encounter: Payer: Self-pay | Admitting: *Deleted

## 2016-06-02 ENCOUNTER — Ambulatory Visit: Payer: 59 | Admitting: Cardiovascular Disease

## 2016-06-13 DIAGNOSIS — M546 Pain in thoracic spine: Secondary | ICD-10-CM | POA: Diagnosis not present

## 2016-06-13 DIAGNOSIS — M47816 Spondylosis without myelopathy or radiculopathy, lumbar region: Secondary | ICD-10-CM | POA: Diagnosis not present

## 2016-06-13 DIAGNOSIS — Z6832 Body mass index (BMI) 32.0-32.9, adult: Secondary | ICD-10-CM | POA: Diagnosis not present

## 2016-06-13 DIAGNOSIS — M5136 Other intervertebral disc degeneration, lumbar region: Secondary | ICD-10-CM | POA: Diagnosis not present

## 2016-06-13 DIAGNOSIS — M5440 Lumbago with sciatica, unspecified side: Secondary | ICD-10-CM | POA: Diagnosis not present

## 2016-06-13 DIAGNOSIS — M5126 Other intervertebral disc displacement, lumbar region: Secondary | ICD-10-CM | POA: Diagnosis not present

## 2016-06-13 DIAGNOSIS — M549 Dorsalgia, unspecified: Secondary | ICD-10-CM | POA: Diagnosis not present

## 2016-06-13 DIAGNOSIS — M5416 Radiculopathy, lumbar region: Secondary | ICD-10-CM | POA: Diagnosis not present

## 2016-06-25 ENCOUNTER — Encounter: Payer: Self-pay | Admitting: Cardiovascular Disease

## 2016-06-25 ENCOUNTER — Ambulatory Visit (INDEPENDENT_AMBULATORY_CARE_PROVIDER_SITE_OTHER): Payer: 59 | Admitting: Cardiovascular Disease

## 2016-06-25 VITALS — BP 181/118 | HR 72 | Ht 64.5 in | Wt 199.0 lb

## 2016-06-25 DIAGNOSIS — I10 Essential (primary) hypertension: Secondary | ICD-10-CM

## 2016-06-25 DIAGNOSIS — E669 Obesity, unspecified: Secondary | ICD-10-CM

## 2016-06-25 DIAGNOSIS — I493 Ventricular premature depolarization: Secondary | ICD-10-CM | POA: Diagnosis not present

## 2016-06-25 DIAGNOSIS — Z8249 Family history of ischemic heart disease and other diseases of the circulatory system: Secondary | ICD-10-CM

## 2016-06-25 LAB — BASIC METABOLIC PANEL
BUN: 18 mg/dL (ref 7–25)
CHLORIDE: 106 mmol/L (ref 98–110)
CO2: 21 mmol/L (ref 20–31)
Calcium: 9.3 mg/dL (ref 8.6–10.2)
Creat: 0.83 mg/dL (ref 0.50–1.10)
Glucose, Bld: 85 mg/dL (ref 65–99)
Potassium: 4.2 mmol/L (ref 3.5–5.3)
SODIUM: 139 mmol/L (ref 135–146)

## 2016-06-25 LAB — MAGNESIUM: Magnesium: 2 mg/dL (ref 1.5–2.5)

## 2016-06-25 MED ORDER — METOPROLOL SUCCINATE ER 50 MG PO TB24
ORAL_TABLET | ORAL | 6 refills | Status: DC
Start: 2016-06-25 — End: 2017-02-12

## 2016-06-25 NOTE — Patient Instructions (Addendum)
Your physician has recommended you make the following change in your medication:   1.) start new prescription for metoprolol succ 50 mg. Take 1/2 tablet daily for 1 week. If blood pressure is less than 130-140 and your heart rate is less than 55 then you can continue @ 1/2 tablet. Otherwise increase to 1 tablet daily.   Your physician recommends that you return for lab work TODAY.  Your physician has requested that you have an echocardiogram. Echocardiography is a painless test that uses sound waves to create images of your heart. It provides your doctor with information about the size and shape of your heart and how well your heart's chambers and valves are working. This procedure takes approximately one hour. There are no restrictions for this procedure. This will be done at the [facility] unless other wise requested.  Your physician recommends that you schedule a follow-up appointment in: 6 weeks with Dr Claiborne Billings.

## 2016-06-25 NOTE — Progress Notes (Signed)
Cardiology Office Note    Date:  06/27/2016   ID:  Josslyn Ciolek, DOB 1975-03-13, MRN [id number]  PCP:  Alonza Bogus, MD  Cardiologist:  Shelva Majestic, MD   Cardiology consultation referral by Knute Neu, NP  History of Present Illness:  Cheryl Ballard is a 41 y.o. female who presents to the office for cardiology consultation through the courtesy of Knute Neu, NP for further evaluation of heart palpitations and hypertension.    Cheryl Ballard is a daughter of my patient.  She is a Marine scientist at [employer].  She has a family history for heart disease in her mother who has AF, and her brother who died suddenly at age 37.  She has experienced episodes of nocturnal palpitations with a feeling of skipped heartbeats in her chest and neck.  She typically drinks 1 cup of coffee in the morning.  She does not routinely exercise.  She has had a 40 pound waking over the past 2 years.  Her exercise has been limited due to lumbar disc disease with a bulging disc at L2-L3, a slipped disc at L3, L4, and a herniated disc at L4-L5.  She has symptoms with right leg sciatica.  Recently, she has noticed more episodes of palpitations which seem to be occurring at least 5 times per week.  She also has noticed her blood pressure has been elevated when checked at work and recently has been 150/90 and 150/100.  She had recently seen Knute Neu and she is referred for further evaluation.  Of note, she was evaluated by Dr. Burt Knack over 4 years ago with palpitations and at that time her ECG was normal and reassurance was given.  She denies any difficulty with sleep.  She does not awaken gasping for breath.  She denies daytime sleepiness.  An Epworth Sleepiness Scale score was calculated at 3 in the office today arguing against daytime sleepiness.   Past Medical History:  Diagnosis Date  . Diverticulitis   . S/P colostomy ([place])    had colostomy with surgery, but has been reversed    Past Surgical History:    Procedure Laterality Date  . BOWEL RESECTION    . COLON SURGERY  04/2010  . Colostomy in April     reversal in April 2013    Current Medications: Outpatient Medications Prior to Visit  Medication Sig Dispense Refill  . citalopram (CELEXA) 20 MG tablet Take 1 tablet (20 mg total) by mouth daily. 30 tablet 6  . ibuprofen (ADVIL,MOTRIN) 200 MG tablet Take 800 mg by mouth as needed. For pain     . megestrol (MEGACE) 40 MG tablet Take one daily (Patient not taking: Reported on 11/22/2014) 45 tablet 0   No facility-administered medications prior to visit.      Allergies:   Patient has no known allergies.   Social History   Social History  . Marital status: Married    Spouse name: N/A  . Number of children: N/A  . Years of education: N/A   Social History Main Topics  . Smoking status: Current Some Day Smoker  . Smokeless tobacco: Never Used     Comment: Patient states that she is trying to quit.  . Alcohol use 0.6 oz/week    1 Cans of beer per week     Comment: occasional  . Drug use: No  . Sexual activity: Yes   Other Topics Concern  . None   Social History Narrative   ** Merged History Encounter **  Socially she's been married for 7 years.  She does have an occasional cigarette.  She completed 4 years of college.  She does drink occasional alcohol.  Family History:  The patient's  family history includes Early death in her brother; Heart disease in her brother and mother; Parkinson's disease in her mother.   ROS General: Negative; No fevers, chills, or night sweats;  HEENT: Negative; No changes in vision or hearing, sinus congestion, difficulty swallowing Pulmonary: Negative; No cough, wheezing, shortness of breath, hemoptysis Cardiovascular:  GI: Negative; No nausea, vomiting, diarrhea, or abdominal pain GU: Negative; No dysuria, hematuria, or difficulty voiding Musculoskeletal: Negative; no myalgias, joint pain, or weakness Hematologic/Oncology: Negative;  no easy bruising, bleeding Endocrine: Negative; no heat/cold intolerance; no diabetes Neuro: Negative; no changes in balance, headaches Skin: Negative; No rashes or skin lesions Psychiatric: Positive for some depression on Celexa. Sleep: Negative; No snoring, daytime sleepiness, hypersomnolence, bruxism, restless legs, hypnogognic hallucinations, no cataplexy Other comprehensive 14 point system review is negative.   PHYSICAL EXAM:   VS:  BP (!) 181/118   Pulse 72   Ht 5' 4.5" (1.638 m)   Wt 199 lb (90.3 kg)   BMI 33.63 kg/m     Repeat blood pressure by me was 160/100 initially and later during the exam 164/98.  Wt Readings from Last 3 Encounters:  06/25/16 199 lb (90.3 kg)  05/17/14 172 lb (78 kg)  06/14/13 175 lb 6.4 oz (79.6 kg)    General: Alert, oriented, no distress.  Skin: normal turgor, no rashes, warm and dry HEENT: Normocephalic, atraumatic. Pupils equal round and reactive to light; sclera anicteric; extraocular muscles intact; Fundi .  No AV nicking.  Disc flat.  No exudates Nose without nasal septal hypertrophy Mouth/Parynx benign; Mallinpatti scale 3 Neck: No JVD, no carotid bruits; normal carotid upstroke Lungs: clear to ausculatation and percussion; no wheezing or rales Chest wall: without tenderness to palpitation Heart: PMI not displaced, RRR, s1 s2 normal, 1/6 systolic murmur, no diastolic murmur, no rubs, gallops, thrills, or heaves Abdomen: Mild central adiposity.  soft, nontender; no hepatosplenomehaly, BS+; abdominal aorta nontender and not dilated by palpation. Back: no CVA tenderness Pulses 2+ Musculoskeletal: full range of motion, normal strength, no joint deformities Extremities: no clubbing cyanosis or edema, Homan's sign negative  Neurologic: grossly nonfocal; Cranial nerves grossly wnl Psychologic: Normal mood and affect   Studies/Labs Reviewed:   EKG:  EKG is ordered today.  ECG (independently read by me): Normal sinus rhythm at 72 bpm.  Mild  insignificant RV conduction delay.  PR interval 148 ms, QTc interval 429 ms.  No ST segment changes.  Recent Labs: BMP Latest Ref Rng & Units 06/25/2016 04/24/2016 11/22/2014  Glucose 65 - 99 mg/dL 85 83 87  BUN 7 - 25 mg/dL 18 11 12   Creatinine 0.50 - 1.10 mg/dL 0.83 0.97 0.92  BUN/Creat Ratio 9 - 23 - 11 13  Sodium 135 - 146 mmol/L 139 143 139  Potassium 3.5 - 5.3 mmol/L 4.2 4.1 4.2  Chloride 98 - 110 mmol/L 106 104 100  CO2 20 - 31 mmol/L 21 22 21   Calcium 8.6 - 10.2 mg/dL 9.3 9.3 9.9     Hepatic Function Latest Ref Rng & Units 04/24/2016 11/22/2014 01/24/2013  Total Protein 6.0 - 8.5 g/dL 7.2 7.6 7.1  Albumin 3.5 - 5.5 g/dL 4.3 4.8 4.4  AST 0 - 40 IU/L 17 16 14   ALT 0 - 32 IU/L 19 17 8   Alk Phosphatase 39 - 117  IU/L 57 59 51  Total Bilirubin 0.0 - 1.2 mg/dL 0.4 0.7 0.5  Bilirubin, Direct 0.0 - 0.3 mg/dL - - -    CBC Latest Ref Rng & Units 04/24/2016 11/22/2014 01/24/2013  WBC 3.4 - 10.8 x10E3/uL 9.1 9.1 6.5  Hemoglobin 12.0 - 15.0 g/dL - - 15.1(H)  Hematocrit 34.0 - 46.6 % 42.1 46.7(H) 43.5  Platelets 150 - 379 x10E3/uL 277 270 243   Lab Results  Component Value Date   MCV 92 04/24/2016   MCV 92 11/22/2014   MCV 90.6 01/24/2013   Lab Results  Component Value Date   TSH 2.270 04/24/2016   Lab Results  Component Value Date   HGBA1C 4.9 04/24/2016     BNP No results found for: BNP  ProBNP No results found for: PROBNP   Lipid Panel     Component Value Date/Time   CHOL 150 04/24/2016 1001   TRIG 94 04/24/2016 1001   HDL 61 04/24/2016 1001   CHOLHDL 2.5 04/24/2016 1001   CHOLHDL 2 05/13/2012 1251   VLDL 11.8 05/13/2012 1251   LDLCALC 70 04/24/2016 1001     RADIOLOGY: No results found.   Additional studies/ records that were reviewed today include:  I reviewed the prior evaluation in March 2014 by Dr. Burt Knack.  I reviewed recent laboratory from 04/09/2016    ASSESSMENT:    1. PVCs (premature ventricular contractions)   2. Essential hypertension     3. Mild obesity   4. 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  She is unaware of any sustained runs of tachycardia dysrhythmia.  She has been drinking coffee but only one cup in the morning.  She also has smoked an occasional cigarette and is unaware if the palpitations have occurred more in this setting.  She has a family history for paroxysmal atrial fibrillation in her mother and her brother also had atrial fibrillation and died suddenly in his 7s.  She is obese with a BMI of 33.63 and due to her degenerative disc disease is no longer exercising and this has resulted in a 40 pound weight gain over the past 2 years.  Her most recent laboratory from February 2007 showed an excellent hemoglobin A1c at 4.9.  She was not anemic.  Her chemistry profile was entirely normal.  Thyroid function was normal and her lipids are excellent.  I am recommending that she undergo an echo Doppler study for further evaluation to make certain there is no structural heart disease.  She has stage II hypertension today.  Since her typical complaint is that of palpitations with PVCs, I will initiate beta blocker with Toprol 25 mg for one week and then she will increase this to 50 mg.  I will see her back in the office in 4-6 weeks and at that time, I will know her LV function and additional recommendations will be made and additional therapy may be necessary for optimal blood pressure control.  I reviewed with her the most recent hypertensive guidelines which stage I hypertension begins at a systolic blood pressure of 130.  I will see her in the office in 6 weeks for follow-up evaluation.   Medication Adjustments/Labs and Tests Ordered: Current medicines are reviewed at length with the patient today.  Concerns regarding medicines are outlined above.  Medication changes, Labs  and  Tests ordered today are listed in the Patient Instructions below. Patient Instructions  Your physician has recommended you make the following change in your medication:   1.) start new prescription for metoprolol succ 50 mg. Take 1/2 tablet daily for 1 week. If blood pressure is less than 130-140 and your heart rate is less than 55 then you can continue @ 1/2 tablet. Otherwise increase to 1 tablet daily.   Your physician recommends that you return for lab work TODAY.  Your physician has requested that you have an echocardiogram. Echocardiography is a painless test that uses sound waves to create images of your heart. It provides your doctor with information about the size and shape of your heart and how well your heart's chambers and valves are working. This procedure takes approximately one hour. There are no restrictions for this procedure. This will be done at the [facility] unless other wise requested.  Your physician recommends that you schedule a follow-up appointment in: 6 weeks with Dr Claiborne Billings.         Signed, Shelva Majestic, MD  06/27/2016 4:49 PM    [facility] [address], [place], [place], [place]  [address] Phone: [phone]

## 2016-07-02 ENCOUNTER — Telehealth: Payer: Self-pay | Admitting: Cardiovascular Disease

## 2016-07-02 NOTE — Telephone Encounter (Signed)
Called the patient to schedule her 6 week followup with Dr. Claiborne Billings.  I left my name and number to call me back.

## 2016-07-11 ENCOUNTER — Ambulatory Visit (HOSPITAL_COMMUNITY): Payer: 59 | Attending: Cardiology

## 2016-07-11 ENCOUNTER — Other Ambulatory Visit: Payer: Self-pay

## 2016-07-11 DIAGNOSIS — I340 Nonrheumatic mitral (valve) insufficiency: Secondary | ICD-10-CM | POA: Insufficient documentation

## 2016-07-11 DIAGNOSIS — I493 Ventricular premature depolarization: Secondary | ICD-10-CM | POA: Diagnosis not present

## 2016-07-29 ENCOUNTER — Telehealth: Payer: Self-pay | Admitting: Cardiovascular Disease

## 2016-07-29 NOTE — Telephone Encounter (Signed)
Patient called stating that she was supposed to have a 6 week follow up with Dr. Claiborne Billings (after her ECHO). Per the results the ECHO was normal. She would rather not see a PA only because she has specific questions for Dr. Claiborne Billings concerning her upcoming back surgery. She stated that at this time she will make an appointment with Dr. Sherwood Gambler and their office can be in touch with Dr. Claiborne Billings as needed. She would like to know when she should follow up with Dr. Claiborne Billings at this point moving forward. Will route to the physician for further recommendation.

## 2016-07-29 NOTE — Telephone Encounter (Signed)
New message    Pt is calling about scheduling appt with Dr. Claiborne Billings. It's supposed to be 6 weeks after 5/11 from echo. Next available is in September. Pt does not want to see PA.

## 2016-08-04 NOTE — Telephone Encounter (Signed)
deferred to Dr Claiborne Billings for recommendations.

## 2016-08-13 DIAGNOSIS — Z6833 Body mass index (BMI) 33.0-33.9, adult: Secondary | ICD-10-CM | POA: Diagnosis not present

## 2016-08-13 DIAGNOSIS — I10 Essential (primary) hypertension: Secondary | ICD-10-CM | POA: Diagnosis not present

## 2016-08-13 DIAGNOSIS — M5136 Other intervertebral disc degeneration, lumbar region: Secondary | ICD-10-CM | POA: Diagnosis not present

## 2016-08-13 DIAGNOSIS — M47816 Spondylosis without myelopathy or radiculopathy, lumbar region: Secondary | ICD-10-CM | POA: Diagnosis not present

## 2016-08-13 DIAGNOSIS — M5126 Other intervertebral disc displacement, lumbar region: Secondary | ICD-10-CM | POA: Diagnosis not present

## 2016-08-13 DIAGNOSIS — M5440 Lumbago with sciatica, unspecified side: Secondary | ICD-10-CM | POA: Diagnosis not present

## 2016-08-13 DIAGNOSIS — M5416 Radiculopathy, lumbar region: Secondary | ICD-10-CM | POA: Diagnosis not present

## 2016-08-21 NOTE — Telephone Encounter (Signed)
Echo was normal, but I should see patient in follow-up of her initial visit when she was significantly hypertensive for medication adjustment if needed.  Can add to my schedule to be seen in July or August

## 2016-08-22 NOTE — Telephone Encounter (Signed)
Pt notified she will be in for scheduled appt

## 2016-08-22 NOTE — Telephone Encounter (Signed)
°  Follow Up   Returning call from earlier. Please call. Pt states OK to leave a VM if needed.

## 2016-08-22 NOTE — Telephone Encounter (Signed)
Lm2cb, Pt has appt scheduled 10-13-16 @3pm 

## 2016-10-13 ENCOUNTER — Encounter: Payer: Self-pay | Admitting: Cardiovascular Disease

## 2016-10-13 ENCOUNTER — Ambulatory Visit (INDEPENDENT_AMBULATORY_CARE_PROVIDER_SITE_OTHER): Payer: 59 | Admitting: Cardiovascular Disease

## 2016-10-13 VITALS — BP 126/82 | HR 70 | Ht 64.5 in | Wt 203.0 lb

## 2016-10-13 DIAGNOSIS — R002 Palpitations: Secondary | ICD-10-CM | POA: Diagnosis not present

## 2016-10-13 DIAGNOSIS — Z8249 Family history of ischemic heart disease and other diseases of the circulatory system: Secondary | ICD-10-CM

## 2016-10-13 DIAGNOSIS — I493 Ventricular premature depolarization: Secondary | ICD-10-CM

## 2016-10-13 DIAGNOSIS — I10 Essential (primary) hypertension: Secondary | ICD-10-CM | POA: Diagnosis not present

## 2016-10-13 DIAGNOSIS — E669 Obesity, unspecified: Secondary | ICD-10-CM | POA: Diagnosis not present

## 2016-10-13 NOTE — Patient Instructions (Signed)
Your physician wants you to follow-up in: 1 year or sooner if needed. You will receive a reminder letter in the mail two months in advance. If you don't receive a letter, please call our office to schedule the follow-up appointment.   If you need a refill on your cardiac medications before your next appointment, please call your pharmacy.   

## 2016-10-13 NOTE — Progress Notes (Signed)
Cardiology Office Note    Date:  10/15/2016   ID:  Cheryl Ballard, DOB 08-Oct-1975, MRN [id number]  PCP:  Cheryl Ballard, CNM  Cardiologist:  Cheryl Majestic, MD   Initial cardiology consultation referral by Cheryl Neu, NP  History of Present Illness:  Cheryl Ballard is a 41 y.o. female who was initially seen for cardiology consultation through the courtesy of Cheryl Neu, NP for evaluation of heart palpitations and hypertension in April 2018.  She presents now for a four-month follow-up evaluation.  [name] is a daughter of my patient.  She is a Marine scientist at [employer].  She has a family history for heart disease in her mother who has AF, and her brother who died suddenly at age 54.  She has experienced episodes of nocturnal palpitations with a feeling of skipped heartbeats in her chest and neck.  She typically drinks 1 cup of coffee in the morning.  She does not routinely exercise.  She has had a 40 pound waking over the past 2 years.  Her exercise has been limited due to lumbar disc disease with a bulging disc at L2-L3, a slipped disc at L3, L4, and a herniated disc at L4-L5.  She has symptoms with right leg sciatica.  Recently, she has noticed more episodes of palpitations which seem to be occurring at least 5 times per week.  She also has noticed her blood pressure has been elevated when checked at work and recently has been 150/90 and 150/100.  She had  seen Cheryl Ballard and was referred for further evaluation.  Of note, she was evaluated by Dr. Burt Ballard over 4 years ago with palpitations and at that time her ECG was normal and reassurance was given.  She denies any difficulty with sleep.  She does not awaken gasping for breath.  She denies daytime sleepiness.  An Epworth Sleepiness Scale score was calculated at 3 in the office today arguing against daytime sleepiness.  Since I initially saw her, she has had difficulty secondary to a slipped disc in her back with discomfort walking.  She  has not had any recurrent episodes of significant palpitations.  She underwent an echo Doppler study on 07/11/2016.  This revealed normal EF at 65-70%.  She had normal diastolic parameters.  There was no valvular pathology.  She has felt well on her current regimen of Toprol-XL 50 mg daily.  She denies presyncope or syncope.  She denies chest pain.  She presents for evaluation   Past Medical History:  Diagnosis Date  . Diverticulitis   . S/P colostomy ([place])    had colostomy with surgery, but has been reversed    Past Surgical History:  Procedure Laterality Date  . BOWEL RESECTION    . COLON SURGERY  04/2010  . Colostomy in April     reversal in April 2013    Current Medications: Outpatient Medications Prior to Visit  Medication Sig Dispense Refill  . aspirin EC 81 MG tablet Take 81 mg by mouth daily.    . citalopram (CELEXA) 20 MG tablet Take 1 tablet (20 mg total) by mouth daily. 30 tablet 6  . ibuprofen (ADVIL,MOTRIN) 200 MG tablet Take 800 mg by mouth as needed. For pain     . metoprolol succinate (TOPROL XL) 50 MG 24 hr tablet Take 1/2 tablet daily for 1 week then increase to 1 tablet daily. 30 tablet 6   No facility-administered medications prior to visit.      Allergies:  Patient has no known allergies.   Social History   Social History  . Marital status: Married    Spouse name: N/A  . Number of children: N/A  . Years of education: N/A   Social History Main Topics  . Smoking status: Current Some Day Smoker  . Smokeless tobacco: Never Used     Comment: Patient states that she is trying to quit.  . Alcohol use 0.6 oz/week    1 Cans of beer per week     Comment: occasional  . Drug use: No  . Sexual activity: Yes   Other Topics Concern  . None   Social History Narrative   ** Merged History Encounter **        Socially she's been married for 7 years.  She does have an occasional cigarette.  She completed 4 years of college.  She does drink occasional  alcohol.  Family History:  The patient's  family history includes Early death in her brother; Heart disease in her brother and mother; Parkinson's disease in her mother.   ROS General: Negative; No fevers, chills, or night sweats;  HEENT: Negative; No changes in vision or hearing, sinus congestion, difficulty swallowing Pulmonary: Negative; No cough, wheezing, shortness of breath, hemoptysis Cardiovascular:  GI: Negative; No nausea, vomiting, diarrhea, or abdominal pain GU: Negative; No dysuria, hematuria, or difficulty voiding Musculoskeletal: Negative; no myalgias, joint pain, or weakness Hematologic/Oncology: Negative; no easy bruising, bleeding Endocrine: Negative; no heat/cold intolerance; no diabetes Neuro: Negative; no changes in balance, headaches Skin: Negative; No rashes or skin lesions Psychiatric: Positive for some depression on Celexa. Sleep: Negative; No snoring, daytime sleepiness, hypersomnolence, bruxism, restless legs, hypnogognic hallucinations, no cataplexy Other comprehensive 14 point system review is negative.   PHYSICAL EXAM:   VS:  BP 126/82   Pulse 70   Ht 5' 4.5" (1.638 m)   Wt 203 lb (92.1 kg)   BMI 34.31 kg/m     Repeat blood pressure by me was 122/78  Wt Readings from Last 3 Encounters:  10/13/16 203 lb (92.1 kg)  06/25/16 199 lb (90.3 kg)  05/17/14 172 lb (78 kg)    General: Alert, oriented, no distress.  Skin: normal turgor, no rashes, warm and dry HEENT: Normocephalic, atraumatic. Pupils equal round and reactive to light; sclera anicteric; extraocular muscles intact; Fundi disc flat.  No AV nicking. Nose without nasal septal hypertrophy Mouth/Parynx benign; Mallinpatti scale Neck: No JVD, no carotid bruits; normal carotid upstroke Lungs: clear to ausculatation and percussion; no wheezing or rales Chest wall: without tenderness to palpitation Heart: PMI not displaced, RRR, s1 s2 normal, 1/6 systolic murmur, no diastolic murmur, no rubs,  gallops, thrills, or heaves Abdomen: Mild central adiposity soft, nontender; no hepatosplenomehaly, BS+; abdominal aorta nontender and not dilated by palpation. Back: no CVA tenderness Pulses 2+ Musculoskeletal: full range of motion, normal strength, no joint deformities Extremities: no clubbing cyanosis or edema, Homan's sign negative  Neurologic: grossly nonfocal; Cranial nerves grossly wnl Psychologic: Normal mood and affect   Studies/Labs Reviewed:   EKG:  EKG is ordered today.  ECG (independently read by me): Normal sinus rhythm at 70 bpm.  Normal intervals.  Nose negative.  ST changes.  April 2018 ECG (independently read by me): Normal sinus rhythm at 72 bpm.  Mild insignificant RV conduction delay.  PR interval 148 ms, QTc interval 429 ms.  No ST segment changes.  Recent Labs: BMP Latest Ref Rng & Units 06/25/2016 04/24/2016 11/22/2014  Glucose 65 - 99  mg/dL 85 83 87  BUN 7 - 25 mg/dL _0 Creatinine 0.50 - 1.10 mg/dL 0.83 0.97 0.92  BUN/Creat Ratio 9 - 23 - 11 13  Sodium 135 - 146 mmol/L 139 143 139  Potassium 3.5 - 5.3 mmol/L 4.2 4.1 4.2  Chloride 98 - 110 mmol/L 106 104 100  CO2 20 - 31 mmol/L _1 Calcium 8.6 - 10.2 mg/dL 9.3 9.3 9.9     Hepatic Function Latest Ref Rng & Units 04/24/2016 11/22/2014 01/24/2013  Total Protein 6.0 - 8.5 g/dL 7.2 7.6 7.1  Albumin 3.5 - 5.5 g/dL 4.3 4.8 4.4  AST 0 - 40 IU/L _2 ALT 0 - 32 IU/L _3 Alk Phosphatase 39 - 117 IU/L 57 59 51  Total Bilirubin 0.0 - 1.2 mg/dL 0.4 0.7 0.5  Bilirubin, Direct 0.0 - 0.3 mg/dL - - -    CBC Latest Ref Rng & Units 04/24/2016 11/22/2014 01/24/2013  WBC 3.4 - 10.8 x10E3/uL 9.1 9.1 6.5  Hemoglobin 11.1 - 15.9 g/dL 14.0 15.9 15.1(H)  Hematocrit 34.0 - 46.6 % 42.1 46.7(H) 43.5  Platelets 150 - 379 x10E3/uL 277 270 243   Lab Results  Component Value Date   MCV 92 04/24/2016   MCV 92 11/22/2014   MCV 90.6 01/24/2013   Lab Results  Component Value Date   TSH 2.270 04/24/2016   Lab  Results  Component Value Date   HGBA1C 4.9 04/24/2016     BNP No results found for: BNP  ProBNP No results found for: PROBNP   Lipid Panel     Component Value Date/Time   CHOL 150 04/24/2016 1001   TRIG 94 04/24/2016 1001   HDL 61 04/24/2016 1001   CHOLHDL 2.5 04/24/2016 1001   CHOLHDL 2 05/13/2012 1251   VLDL 11.8 05/13/2012 1251   LDLCALC 70 04/24/2016 1001     RADIOLOGY: No results found.   Additional studies/ records that were reviewed today include:  I reviewed the prior evaluation in March 2014 by Dr. Burt Ballard.  I reviewed recent laboratory from 04/09/2016    ASSESSMENT:    1. Heart palpitations   2. Essential hypertension   3. PVCs (premature ventricular contractions)   4. Mild obesity   5. 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  When I initially saw her 4 months ago she was unaware of any sustained runs of tachycardia dysrhythmia.  She had been drinking coffee but only one cup in the morning.  She also has smoked an occasional cigarette and is unaware if the palpitations have occurred more in this setting.  She has a family history for paroxysmal atrial fibrillation in her mother and her brother also had atrial fibrillation and died suddenly in his 11s.  She is obese with a BMI of 33.63 and due to her degenerative disc disease is no longer exercising and this had resulted in a 40 pound weight gain over the past 2 years.  When I initially saw her, she was hypertensive and was complaining of palpitations.  She was started on Toprol-XL 25 mg and was titrated to 50 mg.  She has felt significantly improved with therapy.  Her blood pressure today is now normal.  She is not having ectopy.  Her ECG shows sinus rhythm in the 70s.  I reviewed her echo Doppler study which essentially is normal.  Showed normal systolic and  diastolic function without valvular abnormalities.  I recommended exercise as tolerated; ideally 5 times per week for 30 minutes at a time.  We discussed the importance of weight loss.  She is obese with a BMI of 34.31.  His long as she remains stable, I will see her in one year for reevaluation.  Medication Adjustments/Labs and Tests Ordered: Current medicines are reviewed at length with the patient today.  Concerns regarding medicines are outlined above.  Medication changes, Labs and Tests ordered today are listed in the Patient Instructions below. Patient Instructions  Your physician wants you to follow-up in: 1 year or sooner if needed. You will receive a reminder letter in the mail two months in advance. If you don't receive a letter, please call our office to schedule the follow-up appointment.   If you need a refill on your cardiac medications before your next appointment, please call your pharmacy.      Signed, Cheryl Majestic, MD  10/15/2016 9:03 PM    [place] [address], [place], [place], [place]  [address] Phone: [phone]

## 2016-11-14 DIAGNOSIS — M5416 Radiculopathy, lumbar region: Secondary | ICD-10-CM | POA: Diagnosis not present

## 2016-11-14 DIAGNOSIS — M5440 Lumbago with sciatica, unspecified side: Secondary | ICD-10-CM | POA: Diagnosis not present

## 2016-11-14 DIAGNOSIS — M47816 Spondylosis without myelopathy or radiculopathy, lumbar region: Secondary | ICD-10-CM | POA: Diagnosis not present

## 2016-11-14 DIAGNOSIS — M5126 Other intervertebral disc displacement, lumbar region: Secondary | ICD-10-CM | POA: Diagnosis not present

## 2016-11-14 DIAGNOSIS — M5136 Other intervertebral disc degeneration, lumbar region: Secondary | ICD-10-CM | POA: Diagnosis not present

## 2016-12-22 ENCOUNTER — Other Ambulatory Visit: Payer: Self-pay | Admitting: Women's Health

## 2016-12-22 DIAGNOSIS — Z1231 Encounter for screening mammogram for malignant neoplasm of breast: Secondary | ICD-10-CM

## 2017-01-09 ENCOUNTER — Ambulatory Visit (HOSPITAL_COMMUNITY)
Admission: RE | Admit: 2017-01-09 | Discharge: 2017-01-09 | Disposition: A | Discharge status: Home / Self Care | Payer: 59 | Source: Ambulatory Visit | Attending: Women's Health | Admitting: Women's Health

## 2017-01-09 DIAGNOSIS — Z1231 Encounter for screening mammogram for malignant neoplasm of breast: Secondary | ICD-10-CM | POA: Diagnosis not present

## 2017-02-12 ENCOUNTER — Other Ambulatory Visit: Payer: Self-pay | Admitting: Cardiovascular Disease

## 2017-03-31 ENCOUNTER — Encounter: Payer: Self-pay | Admitting: Nurse Practitioner

## 2017-03-31 ENCOUNTER — Ambulatory Visit (INDEPENDENT_AMBULATORY_CARE_PROVIDER_SITE_OTHER): Payer: Self-pay | Admitting: Nurse Practitioner

## 2017-03-31 VITALS — BP 130/90 | HR 87 | Temp 101.9°F | Resp 18 | Wt 207.4 lb

## 2017-03-31 DIAGNOSIS — J111 Influenza due to unidentified influenza virus with other respiratory manifestations: Secondary | ICD-10-CM

## 2017-03-31 DIAGNOSIS — R69 Illness, unspecified: Secondary | ICD-10-CM

## 2017-03-31 DIAGNOSIS — R509 Fever, unspecified: Secondary | ICD-10-CM

## 2017-03-31 LAB — POCT INFLUENZA A/B
Influenza A, POC: NEGATIVE
Influenza B, POC: NEGATIVE

## 2017-03-31 MED ORDER — OSELTAMIVIR PHOSPHATE 75 MG PO CAPS: 75.0000 mg | ORAL_CAPSULE | Freq: Two times a day (BID) | ORAL | 0 refills | Status: AC

## 2017-03-31 NOTE — Progress Notes (Signed)
Subjective:  Cheryl Ballard is a 42 y.o. female who presents for evaluation of influenza like symptoms.  Symptoms include fever and malaise.  Fever in the office is 101.9. Onset of symptoms was a few hours ago, and has been unchanged since that time.  Treatment to date:  none.  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 .  The following portions of the patient's history were reviewed and updated as appropriate:  allergies, current medications and past medical history.  Constitutional: positive for fatigue, fevers and malaise, negative for anorexia, chills and sweats Eyes: negative Ears, nose, mouth, throat, and face: positive for sore throat, negative for ear drainage and earaches Respiratory: negative Cardiovascular: negative Gastrointestinal: negative Neurological: positive for headaches, negative for coordination problems, dizziness, tremors, vertigo and weakness Behavioral/Psych: negative Objective:  BP 130/90 (BP Location: Right Arm, Patient Position: Sitting, Cuff Size: Normal)   Pulse 87   Temp (!) 101.9 F (38.8 C) (Oral)   Resp 18   Wt 207 lb 6.4 oz (94.1 kg)   SpO2 97%   BMI 35.05 kg/m  General appearance: alert, cooperative, fatigued, flushed and no distress Head: Normocephalic, without obvious abnormality, atraumatic Eyes: conjunctivae/corneas clear. PERRL, EOM's intact. Fundi benign. Ears: normal TM's and external ear canals both ears Nose: no discharge, turbinates swollen, inflamed, no sinus tenderness Throat: abnormal findings: mild oropharyngeal erythema Lungs: clear to auscultation bilaterally Heart: regular rate and rhythm, S1, S2 normal, no murmur, click, rub or gallop Abdomen: soft, non-tender; bowel sounds normal; no masses,  no organomegaly Pulses: 2+ and symmetric Skin: Skin color, texture, turgor normal. No rashes or lesions Lymph nodes: cervical and submandibular nodes normal Neurologic: Grossly  normal    Assessment:  influenza like syndrome    Plan:  Antivirals per ordrers. Discussed diagnosis and treatment of influenza. Discussed the importance of avoiding unnecessary antibiotic therapy. Educational material distributed and questions answered. Suggested symptomatic OTC remedies. Supportive care with appropriate antipyretics and fluids. Tamiflu per orders. Follow up as needed.

## 2017-03-31 NOTE — Patient Instructions (Addendum)

## 2017-04-02 ENCOUNTER — Telehealth: Payer: Self-pay | Admitting: Emergency Medicine

## 2017-04-02 ENCOUNTER — Encounter: Payer: Self-pay | Admitting: Nurse Practitioner

## 2017-04-02 ENCOUNTER — Other Ambulatory Visit: Payer: Self-pay | Admitting: Nurse Practitioner

## 2017-04-02 MED ORDER — AMOXICILLIN 500 MG PO CAPS: 500.0000 mg | ORAL_CAPSULE | Freq: Two times a day (BID) | ORAL | 0 refills | Status: AC

## 2017-04-02 NOTE — Progress Notes (Signed)
Patient was seen on 1/29 for flu-like symptoms.  Patient calls stating she has developed a "bump' on her left tonsil.  Patient sent pic via My Chart.  Prescription for Amoxicillin sent to preferred pharmacy.

## 2017-05-25 ENCOUNTER — Telehealth: Payer: Self-pay | Admitting: Cardiovascular Disease

## 2017-05-25 DIAGNOSIS — M7989 Other specified soft tissue disorders: Secondary | ICD-10-CM

## 2017-05-25 DIAGNOSIS — M79604 Pain in right leg: Secondary | ICD-10-CM

## 2017-05-25 DIAGNOSIS — R002 Palpitations: Secondary | ICD-10-CM

## 2017-05-25 NOTE — Telephone Encounter (Signed)
New Message   Patient is calling and is requesting a call back. She is concern that she may have a DVT. Please call to discuss.

## 2017-05-25 NOTE — Telephone Encounter (Signed)
Spoke with patient and she is having swelling in her right leg and calf pain. She is having increased palpitations every 2-3 days but sometimes daily. Her mother has A Fib as did her brother. Her brother died suddenly in his 7's from a PE holding his Warfarin for a spinal injection. Discussed with Dr Claiborne Billings and will get a lower extremity venous doppler and a 14 day event monitor. Patient is agreeable to plan. She is scheduled for doppler Wednesday 3/27, she is working Architectural technologist. A message has been sent to scheduling to arrange for monitor.

## 2017-05-27 ENCOUNTER — Ambulatory Visit (HOSPITAL_COMMUNITY)
Admission: RE | Admit: 2017-05-27 | Discharge: 2017-05-27 | Disposition: A | Discharge status: Home / Self Care | Payer: 59 | Source: Ambulatory Visit | Attending: Cardiology | Admitting: Cardiology

## 2017-05-27 DIAGNOSIS — R002 Palpitations: Secondary | ICD-10-CM | POA: Diagnosis not present

## 2017-05-27 DIAGNOSIS — M7989 Other specified soft tissue disorders: Secondary | ICD-10-CM | POA: Diagnosis not present

## 2017-05-27 DIAGNOSIS — M79604 Pain in right leg: Secondary | ICD-10-CM | POA: Diagnosis not present

## 2017-06-18 ENCOUNTER — Ambulatory Visit (INDEPENDENT_AMBULATORY_CARE_PROVIDER_SITE_OTHER): Payer: 59

## 2017-06-18 DIAGNOSIS — R002 Palpitations: Secondary | ICD-10-CM

## 2017-08-10 ENCOUNTER — Encounter: Payer: Self-pay | Admitting: Cardiovascular Disease

## 2017-08-20 ENCOUNTER — Other Ambulatory Visit: Payer: 59 | Admitting: Women's Health

## 2017-08-20 ENCOUNTER — Other Ambulatory Visit: Payer: 59 | Admitting: Adult Health

## 2017-09-18 ENCOUNTER — Other Ambulatory Visit: Payer: Self-pay | Admitting: Cardiovascular Disease

## 2017-09-18 NOTE — Telephone Encounter (Signed)
Rx sent to pharmacy   

## 2017-09-21 IMAGING — MR MR LUMBAR SPINE W/O CM
4 of 5 series · 15 of 48 positions shown · non-contrast
Comparison: 01/22/2011 CT abdomen and pelvis.

CLINICAL DATA: 41 y/o F; lower back pain radiating down the right
leg.

EXAM:
MRI LUMBAR SPINE WITHOUT CONTRAST
TECHNIQUE: Multiplanar, multisequence MR imaging of the lumbar spine was
performed. No intravenous contrast was administered.

[Series 3: T2 · sagittal · 4.0mm · 0.73mm/px · 6 of 15 slices shown (1 of 2)]
[im 1/15]
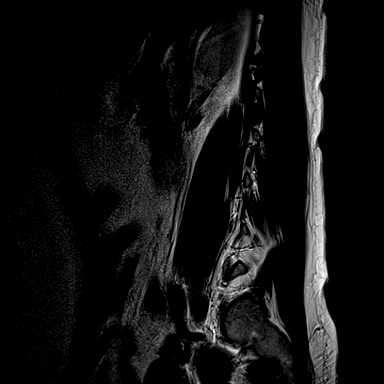
[im 3/15]
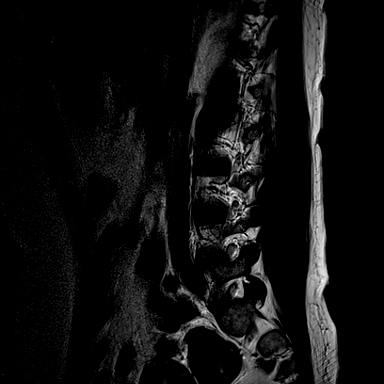
[im 6/15]
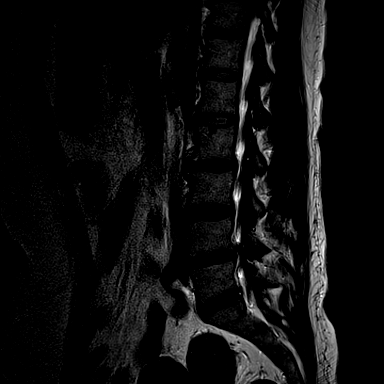
[im 9/15]
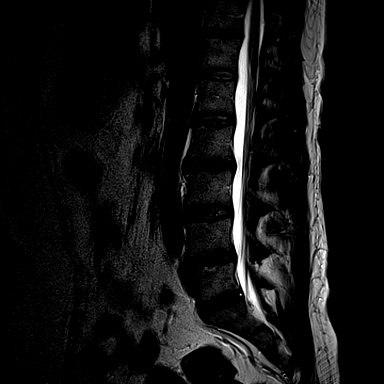
[im 12/15]
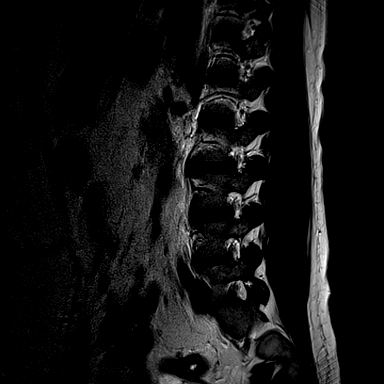
[im 15/15]
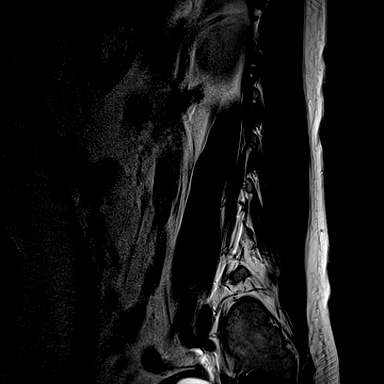

[Series 4: T1 · sagittal · 4.0mm · 0.37mm/px · 3 of 15 slices shown (1 of 2)]
[im 3/15]
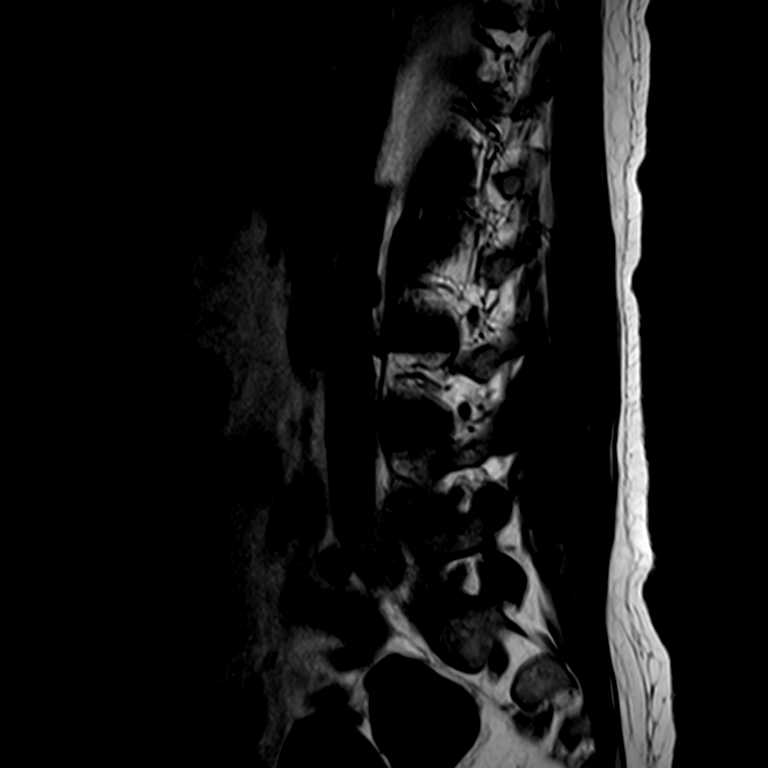
[im 9/15]
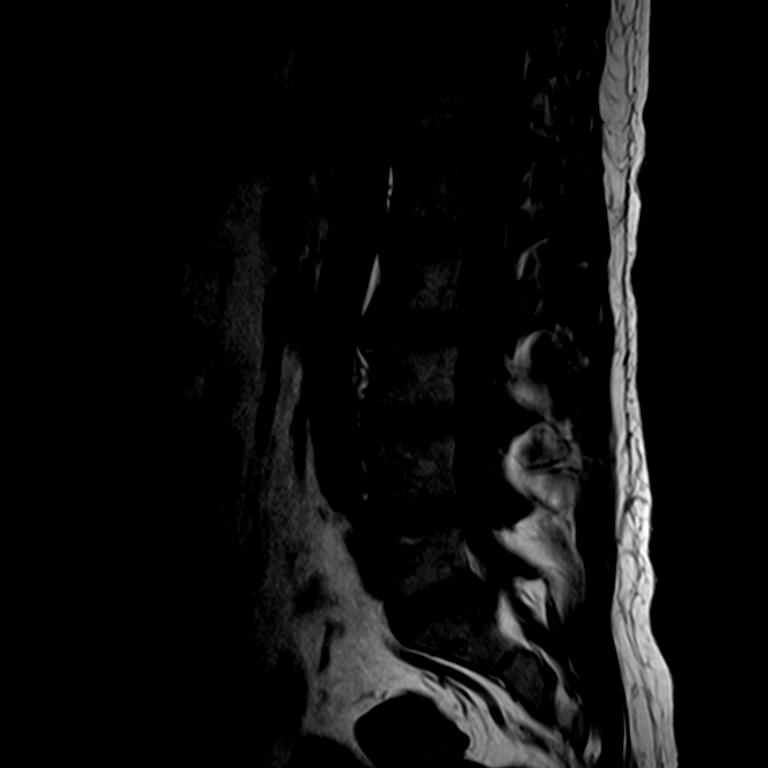
[im 15/15]
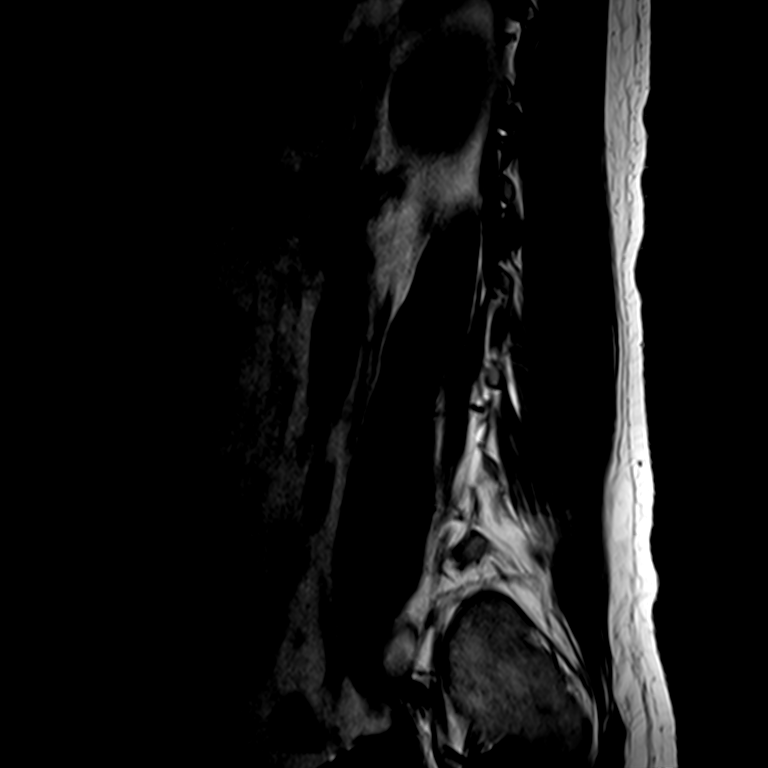

[Series 6: T2 · axial · 4.0mm · 0.22mm/px · z∈[-88,+41]mm · 3 of 36 slices shown (2 of 2)]
[im 6/36]
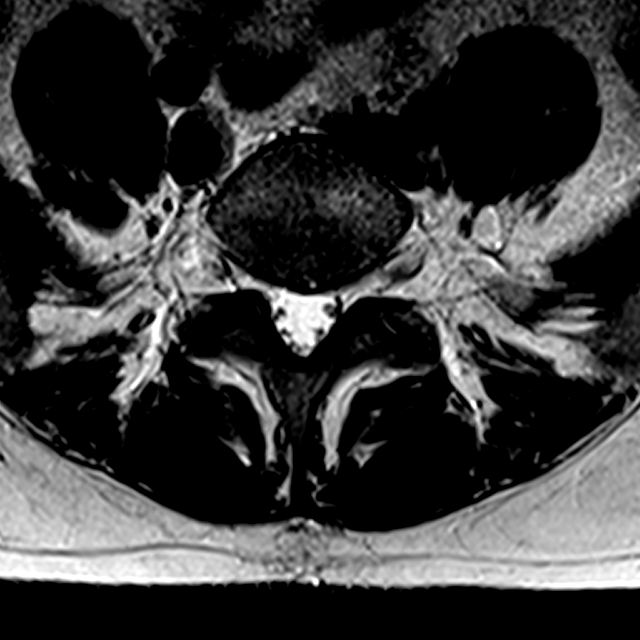
[im 18/36]
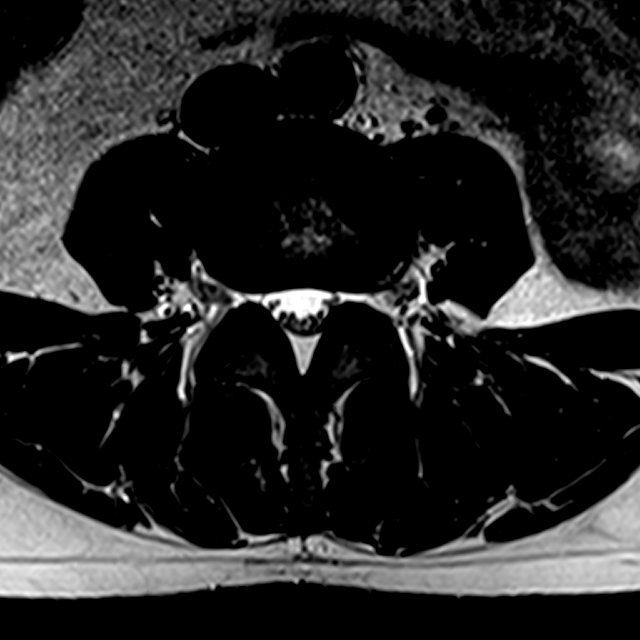
[im 31/36]
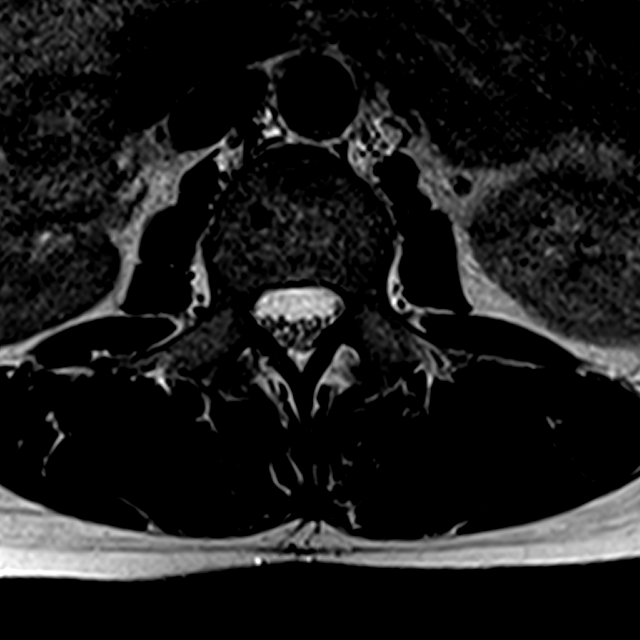

[Series 7: T1 · axial · 4.0mm · 0.21mm/px · z∈[-88,+41]mm · 3 of 36 slices shown (2 of 2)]
[im 6/36]
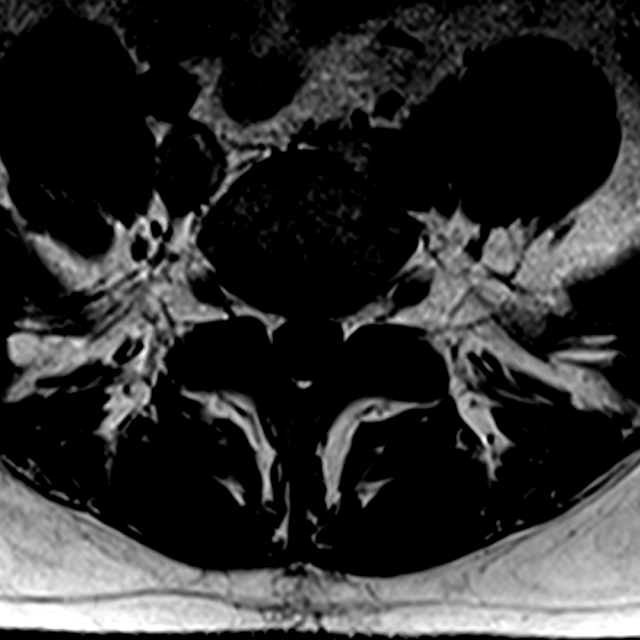
[im 18/36]
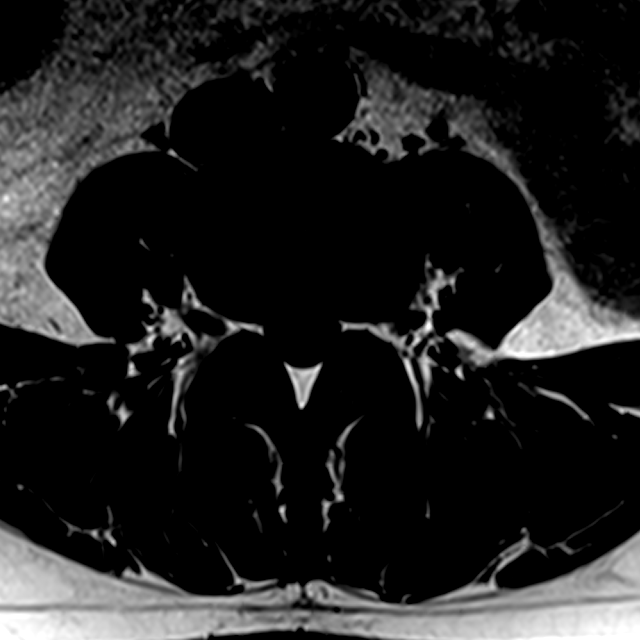
[im 31/36]
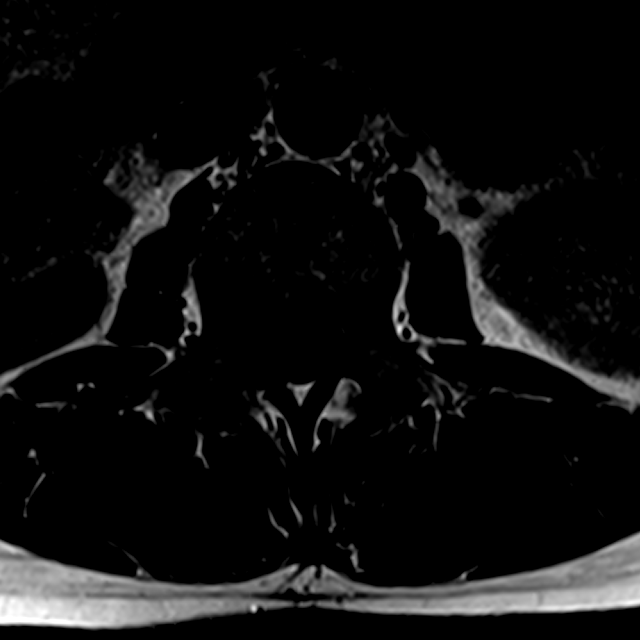

[15 of 48 positions shown; findings below may reference images not displayed]

FINDINGS: Segmentation:  Standard.

Alignment:  Physiologic.

Vertebrae:  No fracture, evidence of discitis, or bone lesion.

Conus medullaris: Extends to the L1 level and appears normal.

Paraspinal and other soft tissues: Negative.

Disc levels:

L1-2: No significant disc displacement, foraminal narrowing, or
canal stenosis.

L2-3: Small disc bulge with tiny central protrusion. No significant
foraminal narrowing or canal stenosis.

L3-4: Small disc bulge. No significant foraminal narrowing or canal
stenosis.

L4-5: Small disc bulge with right subarticular disc extrusion with
slight superior migration measuring 5 x 7 x 13 mm (AP x ML x CC
series 3, image 7 and series 6, image 24). The disc extrusion makes
contact upon the exiting right-sided L4 and descending right-sided
L5 nerve roots.

L5-S1: Small disc bulge with central annular fissure. No significant
canal stenosis or foraminal narrowing.
IMPRESSION: 1. Small right L4-5 subarticular disc extrusion partially effacing
right neural foramen and lateral recess with contact upon the
exiting right L4 and descending right L5 nerve roots.
2. Otherwise no significant foraminal narrowing or canal stenosis.
Multiple small disc bulges from L2 through S1.

By: Arina Pozdeev M.D.

## 2017-09-23 DIAGNOSIS — H5213 Myopia, bilateral: Secondary | ICD-10-CM | POA: Diagnosis not present

## 2017-10-15 NOTE — Telephone Encounter (Signed)
error 

## 2017-12-26 ENCOUNTER — Other Ambulatory Visit: Payer: Self-pay | Admitting: Obstetrics & Gynecology

## 2017-12-26 DIAGNOSIS — R399 Unspecified symptoms and signs involving the genitourinary system: Secondary | ICD-10-CM

## 2017-12-26 MED ORDER — PHENAZOPYRIDINE HCL 200 MG PO TABS: 200.0000 mg | ORAL_TABLET | Freq: Three times a day (TID) | ORAL | 3 refills | Status: DC | PRN

## 2017-12-26 MED ORDER — CIPROFLOXACIN HCL 500 MG PO TABS: 500.0000 mg | ORAL_TABLET | Freq: Two times a day (BID) | ORAL | 1 refills | Status: DC

## 2017-12-26 NOTE — Progress Notes (Signed)
Patient reported having symptoms of UTI; desires medication to treat. Ciprofloxacin and Pyridium prescribed. Advised to come to office for further evaluation if symptoms worsen or are persistent after treatment.  Verita Schneiders, MD, [place] for [employer], [place]

## 2018-01-11 ENCOUNTER — Telehealth: Payer: Self-pay | Admitting: *Deleted

## 2018-01-11 ENCOUNTER — Other Ambulatory Visit: Payer: Self-pay | Admitting: Obstetrics & Gynecology

## 2018-01-11 DIAGNOSIS — N920 Excessive and frequent menstruation with regular cycle: Secondary | ICD-10-CM

## 2018-01-11 DIAGNOSIS — R102 Pelvic and perineal pain: Secondary | ICD-10-CM

## 2018-01-11 MED ORDER — HYDROCODONE-IBUPROFEN 5-200 MG PO TABS: 1.0000 | ORAL_TABLET | ORAL | 0 refills | Status: DC | PRN

## 2018-01-11 NOTE — Telephone Encounter (Signed)
done

## 2018-01-12 ENCOUNTER — Encounter: Payer: Self-pay | Admitting: Women's Health

## 2018-01-12 ENCOUNTER — Ambulatory Visit (INDEPENDENT_AMBULATORY_CARE_PROVIDER_SITE_OTHER): Payer: 59

## 2018-01-12 ENCOUNTER — Ambulatory Visit (INDEPENDENT_AMBULATORY_CARE_PROVIDER_SITE_OTHER): Payer: 59 | Admitting: Women's Health

## 2018-01-12 VITALS — BP 139/80 | HR 78 | Ht 65.0 in | Wt 206.0 lb

## 2018-01-12 DIAGNOSIS — R102 Pelvic and perineal pain: Secondary | ICD-10-CM

## 2018-01-12 DIAGNOSIS — D259 Leiomyoma of uterus, unspecified: Secondary | ICD-10-CM

## 2018-01-12 DIAGNOSIS — N9489 Other specified conditions associated with female genital organs and menstrual cycle: Secondary | ICD-10-CM

## 2018-01-12 DIAGNOSIS — N920 Excessive and frequent menstruation with regular cycle: Secondary | ICD-10-CM | POA: Diagnosis not present

## 2018-01-12 DIAGNOSIS — N921 Excessive and frequent menstruation with irregular cycle: Secondary | ICD-10-CM | POA: Diagnosis not present

## 2018-01-12 LAB — POCT HEMOGLOBIN: HEMOGLOBIN: 13.3 g/dL (ref 9.5–13.5)

## 2018-01-12 NOTE — Progress Notes (Addendum)
PELVIC US TA/TV: homogeneous anteverted uterus with a posterior left intramural fibroid 2.3 x 1.8 x 1.6 cm ,mult simple nabothian cysts,EEC 7.5 mm,normal right ovary,left adnexa contains a hypervascular solid heterogeneous mass adjacent to left ovary 3 x 1.9 x 2.7 cm and a 6 mm calcification w/posterior shadowing,I was unable to separate the heterogeneous mass from the uterus or ovary, this could be a fibroid,tube or a ovarian mass,no free fluid,no pain during ultrasound,ovaries appear mobile

## 2018-01-12 NOTE — Progress Notes (Signed)
GYN VISIT Patient name: Cheryl Ballard MRN [id number]  Date of birth: 01-16-1976 Chief Complaint:   Follow-up  History of Present Illness:   Cheryl Ballard is a 42 y.o. G74P0020 Caucasian female being seen today for report of bleeding x 1 month, last week or so has been heavier, changing overnight pad q 1hr with 50cent piece sized clots, bad cramps that otc meds were not relieving. Dr. Elonda Husky sent in [place] 11/11 which has helped.  Periods have been getting worse over the last 3 years. Has h/o uterine fibroid.   No LMP recorded. The current method of family planning is none. Last pap 04/09/16. Results were:  neg w/ -HRHPV Review of Systems:   Pertinent items are noted in HPI Denies fever/chills, dizziness, headaches, visual disturbances, fatigue, shortness of breath, chest pain, abdominal pain, vomiting, abnormal vaginal discharge/itching/odor/irritation, problems with periods, bowel movements, urination, or intercourse unless otherwise stated above.  Pertinent History Reviewed:  Reviewed past medical,surgical, social, obstetrical and family history.  Reviewed problem list, medications and allergies. Physical Assessment:   Vitals:   01/12/18 1623  BP: 139/80  Pulse: 78  Weight: 206 lb (93.4 kg)  Height: 5\' 5"  (1.651 m)  Body mass index is 34.28 kg/m.       Physical Examination:   General appearance: alert, well appearing, and in no distress  Mental status: alert, oriented to person, place, and time  Skin: warm & dry   Cardiovascular: normal heart rate noted  Respiratory: normal respiratory effort, no distress  Abdomen: soft, non-tender   Pelvic: examination not indicated  Extremities: no edema   Results for orders placed or performed in visit on 01/12/18 (from the past 24 hour(s))  POCT hemoglobin   Collection Time: 01/12/18  4:28 PM  Result Value Ref Range   Hemoglobin 13.3 9.5 - 13.5 g/dL    Today's pelvic u/s: Images reviewed by LHE  Cheryl Ballard is a 42 y.o. G2P0020  LMP 12/11/2017,she is her for a pelvic sonogram for menorrhagia w/camping.  Uterus                      6.8 x 4.4 x 6.5 cm,vol 155ml  homogeneous anteverted uterus with a posterior left intramural fibroid 2.3 x 1.8 x 1.6 cm   Endometrium          7.5 mm, symmetrical, wnl  Right ovary             2.9 x 1.6 x 3.1 cm, normal  Left ovary                5.1 x 2 x 3.8 cm, left adnexa contains a hypervascular solid heterogeneous mass adjacent to left ovary 3 x 1.9 x 2.7 cm, left ovary has a  6 mm calcification w/posterior shadowing,I was unable to separate the heterogeneous mass from the uterus, this could be a fibroid,tube or a ovarian mass   Technician Comments:  PELVIC US TA/TV: homogeneous anteverted uterus with a posterior left intramural fibroid 2.3 x 1.8 x 1.6 cm ,mult simple nabothian cysts,EEC 7.5 mm,normal right ovary,left adnexa contains a hypervascular solid heterogeneous mass adjacent to left ovary 3 x 1.9 x 2.7 cm and a 6 mm calcification w/posterior shadowing,I was unable to separate the heterogeneous mass from the uterus or ovary, this could be a fibroid,tube or a ovarian mass,no free fluid,no pain during ultrasound,ovaries appear mobile  [employer] 01/12/2018 4:55 PM Assessment & Plan:  1) Menorrhagia w/ irregular and prolonged cycle>  today's u/s shows small left intramural fibroid, mass Lt adnexa- LHE feels is possibly either fibroid, cyst, or tube. Pt has h/o extensive bowel surgery/colostomy 04/2010 and in 06/2010 had colostomy takedown and reanastomosis at which, so LHE feels this could likely be related to her past surgeries as well. Does want f/u u/s in 73mths to monitor stability. Discussed option of ablation for period management. Cheryl Ballard wants to try megace at this point, will let us know if she decides for ablation.   Meds:  Meds ordered this encounter  Medications  . megestrol (MEGACE) 40 MG tablet    Sig: 3x5d, 2x5d, then 1 daily to help control vaginal bleeding.  Stop taking when bleeding stops.    Dispense:  45 tablet    Refill:  1    Order Specific Question:   Supervising Provider    Answer:   Tania Ade H [[id number]]    Orders Placed This Encounter  Procedures  . US PELVIS (TRANSABDOMINAL ONLY)  . US PELVIS TRANSVANGINAL NON-OB (TV ONLY)  . POCT hemoglobin    Return in about 3 months (around 04/14/2018) for US:GYN and f/u w/ LHE.  [place], [facility] 01/12/18

## 2018-01-13 ENCOUNTER — Other Ambulatory Visit: Payer: 59

## 2018-01-13 MED ORDER — MEGESTROL ACETATE 40 MG PO TABS: ORAL_TABLET | ORAL | 1 refills | Status: DC

## 2018-01-19 ENCOUNTER — Other Ambulatory Visit: Payer: Self-pay | Admitting: Cardiovascular Disease

## 2018-01-19 NOTE — Telephone Encounter (Signed)
Courtesy refill  

## 2018-02-22 ENCOUNTER — Other Ambulatory Visit: Payer: Self-pay | Admitting: Cardiovascular Disease

## 2018-03-01 ENCOUNTER — Other Ambulatory Visit: Payer: Self-pay

## 2018-03-01 ENCOUNTER — Telehealth: Payer: Self-pay | Admitting: Cardiovascular Disease

## 2018-03-01 MED ORDER — METOPROLOL SUCCINATE ER 50 MG PO TB24: ORAL_TABLET | ORAL | 0 refills | Status: DC

## 2018-03-01 NOTE — Telephone Encounter (Signed)
Patient was made aware she needed an appointment to receive further refills. Per Jeanene Erb she attempted to contact patient, but the appointment dates are booked out for [facility], and asked if I could give the refills before patient ran out. I gave patient a 3 month supply, and advised Jeanene Erb that she needed to be scheduled within that time frame.

## 2018-03-01 NOTE — Telephone Encounter (Signed)
Called patient and LVM regarding 3 month refill of medication.  I also reminded her that she had to see Dr. Claiborne Billings within 3 months and I scheduled an appointment for the patient with Dr. Claiborne Billings on 05-26-18 with Dr. Claiborne Billings.

## 2018-04-13 ENCOUNTER — Ambulatory Visit (INDEPENDENT_AMBULATORY_CARE_PROVIDER_SITE_OTHER): Payer: No Typology Code available for payment source

## 2018-04-13 ENCOUNTER — Ambulatory Visit: Payer: 59 | Admitting: Obstetrics & Gynecology

## 2018-04-13 ENCOUNTER — Encounter: Payer: Self-pay | Admitting: Obstetrics & Gynecology

## 2018-04-13 VITALS — BP 153/90 | HR 80 | Ht 65.0 in | Wt 209.0 lb

## 2018-04-13 DIAGNOSIS — N9489 Other specified conditions associated with female genital organs and menstrual cycle: Secondary | ICD-10-CM | POA: Diagnosis not present

## 2018-04-13 DIAGNOSIS — N921 Excessive and frequent menstruation with irregular cycle: Secondary | ICD-10-CM

## 2018-04-13 DIAGNOSIS — D259 Leiomyoma of uterus, unspecified: Secondary | ICD-10-CM

## 2018-04-13 NOTE — Progress Notes (Signed)
Follow up appointment for results  Chief Complaint  Patient presents with  . Follow-up    [place] today    Blood pressure (!) 153/90, pulse 80, height 5\' 5"  (1.651 m), weight 209 lb (94.8 kg).  GYNECOLOGIC SONOGRAM   Cheryl Ballard is a 43 y.o. G2P0020  Unknown LMP,she is here for a pelvic sonogram for menorrhagia and to f/u left adnexal mass.Marland Kitchen  Uterus                      7 x 3.8 x 5.6 cm, Total uterine volume 77 cc, heterogeneous anteverted uterus with a left LUS intramural fibroid 2.1 x 1.5 x 2.2 cm  Endometrium          13.5 mm, symmetrical, wnl  Right ovary             3.1 x 1.8 x 1.6 cm, wnl  Left ovary                3.6 x 2.7 x 3.2 cm,simple left dominate follicle 2.1 x 1.3 x 1.9 cm,6 mm left ovarian calcification, Heterogeneous, hypoechoic left adnexal mass 2.1 x 1.3 x 1.9 cm N/C,unable to separate from uterus or ovary.  No free fluid   Technician Comments:  PELVIC US TA/TV: heterogeneous anteverted uterus with a left  LUS intramural fibroid 2.1 x 1.5 x 2.2 cm,EEC 13.5 mm,normal right ovary,simple left dominate follicle 2.1 x 1.3 x 1.9 cm,6 mm left ovarian calcification,Dr Jeanifer Halliday reviewed images and agrees there is no significant changes in left adnexal mass and that it is associated with post operative changes from a previous ruptured diverticulitis,no free fluid      Silver Huguenin 04/13/2018 5:13 PM  Clinical Impression and recommendations:  I have reviewed the sonogram results above, combined with the patient's current clinical course, below are my impressions and any appropriate recommendations for management based on the sonographic findings.  Uterus is normal with small myoma Endometrium normal in menstruating woman Left adnexa is complicated but appears to have stable adhesions and post surgical changes associated with history of ruptured diverticular abscess and sigmoid resection and reanastomosis, no change from previous scan   Florian Buff 04/14/2018 9:19 PM     MEDS ordered this encounter: No orders of the defined types were placed in this encounter.   Orders for this encounter: No orders of the defined types were placed in this encounter.   Impression: Adnexal mass  Menorrhagia with irregular cycle  Uterine leiomyoma, unspecified location   Plan: No further evaluation needed unless clinically indicated by patient course  Follow Up: Return if symptoms worsen or fail to improve.       Face to face time:  10 minutes  Greater than 50% of the visit time was spent in counseling and coordination of care with the patient.  The summary and outline of the counseling and care coordination is summarized in the note above.   All questions were answered.  Past Medical History:  Diagnosis Date  . Diverticulitis   . S/P colostomy ([place])    had colostomy with surgery, but has been reversed    Past Surgical History:  Procedure Laterality Date  . BOWEL RESECTION    . COLON SURGERY  04/2010  . Colostomy in April     reversal in April 2013    OB History    Gravida  2   Para  0   Term  0   Preterm  0  AB  2   Living        SAB  1   TAB  0   Ectopic  0   Multiple      Live Births              No Known Allergies  Social History   Socioeconomic History  . Marital status: Married    Spouse name: Not on file  . Number of children: Not on file  . Years of education: Not on file  . Highest education level: Not on file  Occupational History  . Not on file  Social Needs  . Financial resource strain: Not on file  . Food insecurity:    Worry: Not on file    Inability: Not on file  . Transportation needs:    Medical: Not on file    Non-medical: Not on file  Tobacco Use  . Smoking status: Current Some Day Smoker  . Smokeless tobacco: Never Used  . Tobacco comment: Patient states that she is trying to quit.  Substance and Sexual Activity  . Alcohol use: Yes     Alcohol/week: 1.0 standard drinks    Types: 1 Cans of beer per week    Comment: occasional  . Drug use: No  . Sexual activity: Yes  Lifestyle  . Physical activity:    Days per week: Not on file    Minutes per session: Not on file  . Stress: Not on file  Relationships  . Social connections:    Talks on phone: Not on file    Gets together: Not on file    Attends religious service: Not on file    Active member of club or organization: Not on file    Attends meetings of clubs or organizations: Not on file    Relationship status: Not on file  Other Topics Concern  . Not on file  Social History Narrative   ** Merged History Encounter **        Family History  Problem Relation Age of Onset  . Heart disease Mother   . Parkinson's disease Mother   . Early death Brother   . Heart disease Brother

## 2018-04-13 NOTE — Progress Notes (Addendum)
PELVIC US TA/TV: heterogeneous anteverted uterus with a left posterior LUS intramural fibroid 2.1 x 1.5 x 2.2 cm,EEC 13.5 mm,normal right ovary,simple left dominate follicle 2.1 x 1.3 x 1.9 cm,6 mm left ovarian calcification,Dr Eure reviewed images and agrees there is no significant changes in left adnexal mass and that it is associated with post operative changes from a previous ruptured diverticulitis,no free fluid

## 2018-04-15 ENCOUNTER — Encounter: Payer: Self-pay | Admitting: Obstetrics & Gynecology

## 2018-04-21 ENCOUNTER — Encounter: Payer: Self-pay | Admitting: Family Medicine

## 2018-04-21 ENCOUNTER — Ambulatory Visit (INDEPENDENT_AMBULATORY_CARE_PROVIDER_SITE_OTHER): Payer: No Typology Code available for payment source | Admitting: Family Medicine

## 2018-04-21 VITALS — BP 124/84 | HR 84 | Resp 16 | Ht 65.0 in | Wt 208.0 lb

## 2018-04-21 DIAGNOSIS — E669 Obesity, unspecified: Secondary | ICD-10-CM | POA: Diagnosis not present

## 2018-04-21 DIAGNOSIS — I10 Essential (primary) hypertension: Secondary | ICD-10-CM

## 2018-04-21 DIAGNOSIS — R002 Palpitations: Secondary | ICD-10-CM

## 2018-04-21 DIAGNOSIS — G8929 Other chronic pain: Secondary | ICD-10-CM

## 2018-04-21 DIAGNOSIS — F418 Other specified anxiety disorders: Secondary | ICD-10-CM | POA: Diagnosis not present

## 2018-04-21 DIAGNOSIS — M5440 Lumbago with sciatica, unspecified side: Secondary | ICD-10-CM

## 2018-04-21 MED ORDER — ESCITALOPRAM OXALATE 10 MG PO TABS: 10.0000 mg | ORAL_TABLET | Freq: Every day | ORAL | 0 refills | Status: DC

## 2018-04-21 NOTE — Patient Instructions (Addendum)
    Thank you for coming into the office today. I appreciate the opportunity to provide you with the care for your health and wellness.  Goal: 3 x a week: exercise of your choice.   Goal: smoking cessation: slow down on how many you smoke a day when you do smoke.  Start Lexapro 10 mg daily. Update your status in MyChart or by call.   Well balanced diet, drink lots water.  GET SOME SUNSHINE AS MUCH AS POSSIBLE.   Affirmations:  I am enough.  I am strong.  I am capable.  I am smart.  I am kind.  I am loving.  It was a pleasure to see you and I look forward to continuing to work together on your health and well-being. Please do not hesitate to call the office if you need care or have questions about your care.  Have a wonderful day and week.  With Gratitude,  [name], DNP, AGNP-BC

## 2018-04-21 NOTE — Progress Notes (Signed)
New Patient Office Visit  Subjective:  Patient ID: Cheryl Ballard, female    DOB: September 03, 1975  Age: 43 y.o. MRN: [id number]  CC:  Chief Complaint  Patient presents with  . Establish Care  . Depression    would like to discuss starting an antidepressant. Has tried celexa and wellbutrin with little results     HPI Cheryl Ballard) 43 year old female patient who presents today for new patient appointment.  She has not been to this clinic previously.  Would like to establish care, and talk about having any at the present start. Current past medical history includes anxiety, reporting that the cycles around.  Given situational events, same with depression.  History of having back pain secondary to having a bulging disc.  Diverticulitis, hypertension.  Status post a colostomy reversal 2010-06-13, she only had the colostomy for 3 months, secondary to having had emergency resection.   Current medications are Toprol 50 mg once daily, prescribed by cardiology Dr. Claiborne Billings.  Is followed by cardiology regularly.  Reports being on metoprolol secondary to having PVCs, and high blood pressure. reports that it is doing well.  Does not have any chest pain, palpitations, shortness of breath, leg swelling, or cough.  Blood pressure good today in office.  Is also been seen by Dr. Sherwood Gambler, due to back issues, secondary to bulging disc.  She will need to have referrals to both cardiology and Dr. Sherwood Gambler as well.  She has become a focus plan.  She reports that she does follow-up with her eye doctor wears contacts/glasses.  Does see the dentist regularly.  Is followed by GYN, with family history Dr. year.  Receives Pap smear modifiers there.  Other health maintenance includes not having testing for HIV, in the chart.  To have up-to-date on tetanus.  Unsure when this was we will find out and let us know.  BMI is 34.61, she is been in this range for a while.  Said that she started gaining weight in 06/13/2014.  Is caring for her mom  currently.  Is the sole caregiver secondary to her brother passing away.  Believes this is also why she is gaining weight because she is not taking as good care of herself as she could.  Would like to talk about having antidepressant given so that she can feel that more motivated and calm.  Reports being tearful frequently.  Reports falling asleep well but does wake up.  Socially she lives with her husband who she has been married to for 7 years, this may.  I have a great Dane named Ezel who is 25 years old.  No children.  Of note she has been married before her first marriage was 5 years.  Husband suddenly passed away in Jun 13, 1998 having a traumatic brain injury secondary to having a diabetic seizure.  Has previously stated she is the sole caregiver of her mom currently who has Parkinson's and significant degenerative disc disease.  She is a smoker, not many people notice about her.  She would like to keep it that way.  Smokes about half a pack a week.  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  Reports enjoying her job.  Reports occasional alcohol use.  Reports wearing sunscreen in her seatbelt.  Trying to eat a healthy diet but does not always follow this.  Does report that she consumes milk regularly not concerned  for having not having calcium vitamin D in her diet.  Is not actually in any type of exercise regime.  Overall today she feels well, is a little tearful and communication.  Would like to have something to help with depression and/or anxiety.  In the past she has tried Wellbutrin about 5 years ago no success.  She would like to avoid this.  In the past she has been on Celexa about a year to year and half ago, stayed on this for 4 months.  Did not feel like it helped her.  Has mention of Lexapro today in the office would like to try that.   Past Medical History:  Diagnosis Date  . Anxiety    cycles around    . Back pain   . Depression   . Diverticulitis   . Hypertension   . S/P colostomy ([place])    had colostomy with surgery, but has been reversed    Past Surgical History:  Procedure Laterality Date  . BOWEL RESECTION    . COLON SURGERY  04/2010  . Colostomy in April     reversal in April 2013    Family History  Problem Relation Age of Onset  . Heart disease Mother        afib  . Parkinson's disease Mother   . Arthritis Mother   . Early death Brother   . Heart disease Brother   . Heart disease Maternal Grandmother        MI-CAD    Social History   Socioeconomic History  . Marital status: Married    Spouse name: Gwyndolyn Saxon   . Number of children: 0  . Years of education: 61  . Highest education level: Bachelor's degree (e.g., BA, AB, BS)  Occupational History  . 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 Financial resource strain: Not hard at all  . Food insecurity:    Worry: Never true    Inability: Never true  . Transportation needs:    Medical: No    Non-medical: No  Tobacco Use  . Smoking status: Current Some Day Smoker    Packs/day: 0.50    Years: 23.00    Pack years: 11.50  . Smokeless tobacco: Never Used  . Tobacco comment: Patient states that she is trying to quit.  Substance and Sexual Activity  . Alcohol use: Yes    Alcohol/week: 1.0 standard drinks    Types: 1 Cans of beer per week    Comment: occasional  . Drug use: No  . Sexual activity: Yes  Lifestyle  . Physical activity:    Days per week: 0 days    Minutes per session: 0 min  . Stress: Very much  Relationships  . Social connections:    Talks on phone: More than three times a week    Gets together: Once a week    Attends religious service: More than 4 times per year    Active member of club or organization: No    Attends meetings of clubs or organizations: Never    Relationship status: Married  . Intimate partner violence:    Fear of current or  ex partner: No    Emotionally abused: No    Physically abused: No    Forced sexual activity: No  Other Topics Concern  . Not on file  Social History Narrative   Lives with Gwyndolyn Saxon, husband of 4 years in  May of 2020.   Great Dane named, Diesel who is 45 years old.      Main caregiver for mom-who has parkinson's and DDD, she is 70 years old (2020).      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       Wears seat belt.   Wears sunscreen.   Eats fruits and veggies.   Consumes milk regularly.      ROS Review of Systems  Constitutional: Negative for activity change, appetite change, chills and fever.  HENT: Negative for congestion, sinus pressure, sinus pain and sore throat.   Eyes: Negative for pain, itching and visual disturbance.       Sees eye dr yearly   Respiratory: Negative for cough, choking, chest tightness, shortness of breath and wheezing.   Cardiovascular: Positive for palpitations. Negative for chest pain.       On toprol XL 50mg  daily   Gastrointestinal: Negative for blood in stool, constipation, diarrhea, nausea and vomiting.  Endocrine: Negative for cold intolerance, heat intolerance, polydipsia, polyphagia and polyuria.  Genitourinary: Negative for difficulty urinating, hematuria, vaginal bleeding, vaginal discharge and vaginal pain.       Lmc was nov of 2019, with breakthrough bleeding  Musculoskeletal: Positive for arthralgias and back pain.       Bulging and erupted disc   RIGHT knee and hip pain   Allergic/Immunologic: Negative for environmental allergies and food allergies.  Neurological: Negative for dizziness, syncope, weakness, light-headedness and headaches.  Hematological: Negative for adenopathy. Does not bruise/bleed easily.  Psychiatric/Behavioral: Positive for sleep disturbance. Negative for confusion and decreased concentration. The patient is nervous/anxious.     Objective:   Today's Vitals: BP 124/84   Pulse 84   Resp 16   Ht 5\' 5"  (1.651  m)   Wt 208 lb (94.3 kg)   LMP 01/19/2018 (Approximate)   SpO2 98%   BMI 34.61 kg/m   Physical Exam Vitals signs and nursing note reviewed.  Constitutional:      Appearance: Normal appearance. She is obese.  HENT:     Head: Normocephalic.     Right Ear: Tympanic membrane, ear canal and external ear normal.     Left Ear: Tympanic membrane, ear canal and external ear normal.     Nose: Nose normal.     Mouth/Throat:     Mouth: Mucous membranes are moist.     Pharynx: Oropharynx is clear.  Eyes:     Conjunctiva/sclera: Conjunctivae normal.  Neck:     Musculoskeletal: Normal range of motion and neck supple.  Cardiovascular:     Rate and Rhythm: Normal rate and regular rhythm.     Pulses: Normal pulses.  Pulmonary:     Effort: Pulmonary effort is normal.     Breath sounds: Normal breath sounds.  Abdominal:     General: Abdomen is flat.     Palpations: Abdomen is soft.  Musculoskeletal: Normal range of motion.  Skin:    General: Skin is warm and dry.     Capillary Refill: Capillary refill takes less than 2 seconds.  Neurological:     Mental Status: She is alert and oriented to person, place, and time.  Psychiatric:        Mood and Affect: Mood normal.        Behavior: Behavior normal.        Thought Content: Thought content normal.        Judgment: Judgment normal.     Assessment & Plan:  1. Obesity (BMI 30.0-34.9) Today in office BMI 34.61, she has pain.  This for the last several months.  Reports gaining weight since 2016.  Would like to lose weight.  But is struggling at the current moment.  Secondary to taking care of her mother and working a full-time job.  Does not always make the best choices.  But does eat fresh fruits and veggies.  They consume a good amount of milk for calcium intake.  Encouraged her to start walking daily as she can.  Would be great for her to be working out 3 days a week for the next couple of weeks to months to working up to get to 5 days a  week 30 minutes each time that she is working out for total of 150 minutes weekly.  This is the best way to get the benefit of cardiovascular and weight loss at that time.  We will check a lipid profile, TSH, A1c to assess this in more detail analysis.  If any of these are elevated will address as needed.  Educated about extra weigh the risk and pressure it puts him back secondary to having a bulging disc.  We will continue to provide her with a referral for Dr.Nudelman as needed.  - Lipid Profile - TSH - HgB A1c  2. Situational anxiety Today patient comes in with tearful situational anxiety.  She has resulted in referral mother.  Who has Parkinson's and DDD.  Her brother has passed away.  She is the only person that can take care of her mother at this time.  Would like to be put on something to help with anti-anxiety and depression.  We will start her on Lexapro 10 mg.  Advised her to reach out either via MyChart and or by call to let me know if this medication works for her as she has been on Wellbutrin, and Celexa before without improvement.  Educated her about the need to be on it for 4 to 12 weeks before usual improvement in fatigue.  - escitalopram (LEXAPRO) 10 MG tablet; Take 1 tablet (10 mg total) by mouth daily.  Dispense: 30 tablet; Refill: 0  3. Essential hypertension Patient reports having a history of having hyper tension, is seen by cardiology.  Also reports having PVCs.  He is on Toprol 50 mg daily extended release.  Does not have any signs or symptoms of chest pain palpitations and leg swelling, or shortness of breath today in the office.  Appreciate cardiology input on her care.  Will allow them to do the navigation of any medications at this time.  We will be glad to be of service and help in collaboration should that time.  We will do a CBC and a CMP for kidney function.  She can continue to have a referral for cardiology as needed.  - CBC - COMPLETE METABOLIC PANEL WITH GFR  4.  Heart palpitations Has a history of having PVCs.  Reports that she did wear a heart monitor, nothing was significantly found on this per her.  We will look back in the chart to see if I can see results from this.  Did not see PVCs added to her history.  It is noted on her heart monitor or and/or a note I will gladly add this to her history.  - CBC - COMPLETE METABOLIC PANEL WITH GFR   Follow-up: Follow-up will be in August of this year.  Pending results of her labs.  Sooner if needed.  Perlie Mayo, NP

## 2018-04-22 ENCOUNTER — Encounter: Payer: Self-pay | Admitting: Family Medicine

## 2018-05-24 ENCOUNTER — Telehealth: Payer: Self-pay

## 2018-05-24 MED ORDER — METOPROLOL SUCCINATE ER 50 MG PO TB24: ORAL_TABLET | ORAL | 0 refills | Status: DC

## 2018-05-24 NOTE — Telephone Encounter (Signed)
   Cardiac Questionnaire:    Since your last visit or hospitalization:    1. Have you been having new or worsening chest pain? No   2. Have you been having new or worsening shortness of breath? No 3. Have you been having new or worsening leg swelling, wt gain, or increase in abdominal girth (pants fitting more tightly)? No   4. Have you had any passing out spells? No  .   Primary Cardiologist:  No primary care provider on file.   Patient contacted.  History reviewed.  No symptoms to suggest any unstable cardiac conditions.  Based on discussion, with current pandemic situation, we will be postponing this appointment for Cheryl Ballard.  If symptoms change, she has been instructed to contact our office.   Routing to C19 CANCEL pool for tracking (P CV DIV CV19 CANCEL) and assigning priority (1 = 4-6 wks, 2 = 6-12 wks, 3 = >12 wks).  Priority 3  Ena Dawley, LPN  3/74/8270 7:86 PM         .

## 2018-05-26 ENCOUNTER — Ambulatory Visit: Payer: 59 | Admitting: Cardiovascular Disease

## 2018-05-31 ENCOUNTER — Telehealth: Payer: Self-pay

## 2018-05-31 NOTE — Telephone Encounter (Signed)
Called patient, LVM regarding doing a virtual visit, left call back number to discuss if she was interested in doing these.

## 2018-06-15 ENCOUNTER — Telehealth: Payer: Self-pay | Admitting: Cardiovascular Disease

## 2018-06-15 NOTE — Telephone Encounter (Signed)
Called patient and LVM to call back to schedule an appointment with Dr. Claiborne Billings in 3 months.

## 2018-06-18 ENCOUNTER — Telehealth: Payer: Self-pay | Admitting: Cardiovascular Disease

## 2018-06-18 NOTE — Telephone Encounter (Signed)
LVM to schedule. ST °

## 2018-06-23 ENCOUNTER — Telehealth: Payer: Self-pay | Admitting: Cardiovascular Disease

## 2018-06-23 NOTE — Telephone Encounter (Signed)
LVM to call back and schedule  °

## 2018-06-23 NOTE — Telephone Encounter (Signed)
New message:    Patient states she was calling back for a appt ST she is not sure she need this. Please call patient.

## 2018-06-24 ENCOUNTER — Other Ambulatory Visit: Payer: Self-pay

## 2018-06-24 DIAGNOSIS — F418 Other specified anxiety disorders: Secondary | ICD-10-CM

## 2018-06-28 ENCOUNTER — Other Ambulatory Visit: Payer: Self-pay

## 2018-06-28 DIAGNOSIS — F418 Other specified anxiety disorders: Secondary | ICD-10-CM

## 2018-06-28 MED ORDER — ESCITALOPRAM OXALATE 10 MG PO TABS: 10.0000 mg | ORAL_TABLET | Freq: Every day | ORAL | 3 refills | Status: DC

## 2018-09-20 ENCOUNTER — Other Ambulatory Visit: Payer: Self-pay | Admitting: Cardiovascular Disease

## 2018-09-21 ENCOUNTER — Telehealth (INDEPENDENT_AMBULATORY_CARE_PROVIDER_SITE_OTHER): Payer: No Typology Code available for payment source | Admitting: Family Medicine

## 2018-09-21 ENCOUNTER — Encounter: Payer: Self-pay | Admitting: Family Medicine

## 2018-09-21 DIAGNOSIS — F418 Other specified anxiety disorders: Secondary | ICD-10-CM | POA: Diagnosis not present

## 2018-09-21 DIAGNOSIS — E669 Obesity, unspecified: Secondary | ICD-10-CM | POA: Diagnosis not present

## 2018-09-21 NOTE — Patient Instructions (Addendum)
    Thank you for coming into the office today. I appreciate the opportunity to provide you with the care for your health and wellness. Today we discussed:   Follow Up: 3 months   No labs or referrals today.  No refills needed  Please work on exercise (30-60 minutes on most days of the week) Eat a well balanced diet.  ENJOY MS MILLIE   And I hope the move for mom to come live with you goes well.  Continue your self care as this will help keep you balanced during these upcoming exciting, but stressful changes.   Also continue to work on not smoking, and if you have stopped, great job! Keep going!  Please continue to practice social distancing to keep you, your family, and our community safe.  If you must go out, please wear a Mask and practice good handwashing.  Taylor YOUR HANDS WELL AND FREQUENTLY. AVOID TOUCHING YOUR FACE, UNLESS YOUR HANDS ARE FRESHLY WASHED.  GET FRESH AIR DAILY. STAY HYDRATED WITH WATER.   It was a pleasure to see you and I look forward to continuing to work together on your health and well-being. Please do not hesitate to call the office if you need care or have questions about your care.  Have a wonderful day and week.  With Gratitude,  [name], DNP, AGNP-BC

## 2018-09-21 NOTE — Progress Notes (Signed)
Subjective:     Patient ID: Cheryl Ballard, female   DOB: 11-07-1975, 43 y.o.   MRN: [id number]   Location of Patient: Home Location of Provider: Telehealth Consent was obtain for visit to be over via telehealth. A video enabled telemedicine application was used and I verified that I am speaking with the correct person using two identifiers.   Cheryl Ballard presents for No chief complaint on file. Cheryl Ballard presents for [place] visit today for follow up on anxiety. Overall doing well. Lexapro is helping her a lot and she feels like staying on it for now. Still on Toprol XL no PVCs or HTN noted.   Getting Cheryl Ballard puppy: name Millie. Mom is moving in with her- she has Parkinson's  Today patient denies signs and symptoms of COVID 19 infection including fever, chills, cough, shortness of breath, and headache.  Past Medical, Surgical, Social History, Allergies, and Medications have been Reviewed   Past Medical History:  Diagnosis Date  . Anxiety    cycles around  . Back pain   . Depression   . Diverticulitis   . Hypertension   . S/P colostomy ([place])    had colostomy with surgery, but has been reversed   Past Surgical History:  Procedure Laterality Date  . BOWEL RESECTION    . COLON SURGERY  04/2010  . Colostomy in April     reversal in April 2013   Social History   Socioeconomic History  . Marital status: Married    Spouse name: Gwyndolyn Saxon   . Number of children: 0  . Years of education: 65  . Highest education level: Bachelor's degree (e.g., BA, AB, BS)  Occupational History  . 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 Financial resource strain: Not hard at all  . Food insecurity    Worry: Never true    Inability: Never true  . Transportation needs    Medical: No    Non-medical: No  Tobacco Use  . Smoking status: Current Some Day Smoker    Packs/day: 0.50    Years: 23.00    Pack years: 11.50  . Smokeless  tobacco: Never Used  . Tobacco comment: Patient states that she is trying to quit.  Substance and Sexual Activity  . Alcohol use: Yes    Alcohol/week: 1.0 standard drinks    Types: 1 Cans of beer per week    Comment: occasional  . Drug use: No  . Sexual activity: Yes  Lifestyle  . Physical activity    Days per week: 0 days    Minutes per session: 0 min  . Stress: Very much  Relationships  . Social connections    Talks on phone: More than three times a week    Gets together: Once a week    Attends religious service: More than 4 times per year    Active member of club or organization: No    Attends meetings of clubs or organizations: Never    Relationship status: Married  . Intimate partner violence    Fear of current or ex partner: No    Emotionally abused: No    Physically abused: No    Forced sexual activity: No  Other Topics Concern  . Not on file  Social History Narrative   Lives with Gwyndolyn Saxon, husband of 4 years in May of 2020.   Great Dane named, Diesel who is 45 years old.  Main caregiver for mom-who has parkinson's and DDD, she is 26 years old (2020).      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       Wears seat belt.   Wears sunscreen.   Eats fruits and veggies.   Consumes milk regularly.      Outpatient Encounter Medications as of 09/21/2018  Medication Sig  . escitalopram (LEXAPRO) 10 MG tablet Take 1 tablet (10 mg total) by mouth daily.  . hydrocodone-ibuprofen (VICOPROFEN) 5-200 MG tablet Take 1 tablet by mouth every 4 (four) hours as needed for pain.  . metoprolol succinate (TOPROL-XL) 50 MG 24 hr tablet TAKE ONE (1) TABLET BY MOUTH EVERY DAY   No facility-administered encounter medications on file as of 09/21/2018.    No Known Allergies  Review of Systems  Constitutional: Negative.   HENT: Negative.   Eyes: Negative.   Respiratory: Negative.   Cardiovascular: Negative.   Gastrointestinal: Negative.   Endocrine: Negative.   Genitourinary:  Negative.   Musculoskeletal: Negative.   Skin: Negative.   Allergic/Immunologic: Negative.   Neurological: Negative.   Hematological: Negative.   Psychiatric/Behavioral: Negative.   All other systems reviewed and are negative.      Objective:     There were no vitals taken for this visit.  Physical Exam Vitals reviewed: video visit, no vitals   Constitutional:      Appearance: Normal appearance. She is obese.  HENT:     Head: Normocephalic and atraumatic.     Right Ear: External ear normal.     Left Ear: External ear normal.     Nose: Nose normal.  Eyes:     Conjunctiva/sclera: Conjunctivae normal.  Neck:     Musculoskeletal: Normal range of motion and neck supple.  Pulmonary:     Comments: No noted shortness of breath  Musculoskeletal: Normal range of motion.  Neurological:     General: No focal deficit present.     Mental Status: She is alert and oriented to person, place, and time.  Psychiatric:        Mood and Affect: Mood normal.        Behavior: Behavior normal.        Thought Content: Thought content normal.        Judgment: Judgment normal.    Depression screen [facility] 2/9 09/21/2018 04/21/2018  Decreased Interest 1 1  Down, Depressed, Hopeless 0 1  PHQ - 2 Score 1 2  Altered sleeping 0 1  Tired, decreased energy 1 1  Change in appetite 0 2  Feeling bad or failure about yourself  0 0  Trouble concentrating 0 0  Moving slowly or fidgety/restless 0 0  Suicidal thoughts 0 0  PHQ-9 Score 2 6  Difficult doing work/chores Not difficult at all Somewhat difficult   GAD 7 : Generalized Anxiety Score 09/21/2018  Nervous, Anxious, on Edge 1  Control/stop worrying 0  Worry too much - different things 1  Trouble relaxing 0  Restless 0  Easily annoyed or irritable 0  Afraid - awful might happen 0  Total GAD 7 Score 2  Anxiety Difficulty Not difficult at all         Assessment and Plan        1. Situational anxiety Overall improved, continue Lexapro at  this time. Encouraged to reach out if things change. Encourage self care and exercise as a form.   2. Obesity (BMI 30.0-34.9) Unchanged, she has not lost or gained weight.  Encouraged her to start walking daily as she can.    Encouraged diet changes as well.   I provided 10 minutes of non-face-to-face time during this encounter.  Follow Up: Oct (3 months)  Perlie Mayo, DNP, AGNP-BC [address], [place] [address] Office Hours: Mon-Thurs 8 am-5 pm; Fri 8 am-12 pm Office Phone:  [phone]  Office Fax: [phone]

## 2018-10-21 ENCOUNTER — Encounter: Payer: Self-pay | Admitting: Family Medicine

## 2018-10-21 ENCOUNTER — Telehealth: Payer: Self-pay | Admitting: *Deleted

## 2018-10-21 DIAGNOSIS — F418 Other specified anxiety disorders: Secondary | ICD-10-CM

## 2018-10-21 MED ORDER — ESCITALOPRAM OXALATE 20 MG PO TABS: 20.0000 mg | ORAL_TABLET | Freq: Every day | ORAL | 1 refills | Status: DC

## 2018-10-21 NOTE — Telephone Encounter (Signed)
[facility] called about the lexapro that was sent in. It was sent in for 20 mg and the pt has always been on 10 mg . They wanted to clarify.

## 2018-10-26 ENCOUNTER — Telehealth: Payer: Self-pay | Admitting: Family Medicine

## 2018-10-26 NOTE — Telephone Encounter (Signed)
Spoke with pharmacy and verified prescription Lexapro prescription is 20mg 

## 2018-10-26 NOTE — Telephone Encounter (Signed)
Please call tammy with the pharmacy  To confirm that the lexapro should be 20mg  [id number])

## 2018-10-28 ENCOUNTER — Ambulatory Visit: Payer: No Typology Code available for payment source | Admitting: Cardiovascular Disease

## 2018-11-03 NOTE — Progress Notes (Signed)
Virtual Visit via Video Note   This visit type was conducted due to national recommendations for restrictions regarding the COVID-19 Pandemic (e.g. social distancing) in an effort to limit this patient's exposure and mitigate transmission in our community.  Due to her co-morbid illnesses, this patient is at least at moderate risk for complications without adequate follow up.  This format is felt to be most appropriate for this patient at this time.  All issues noted in this document were discussed and addressed.  A limited physical exam was performed with this format.  Please refer to the patient's chart for her consent to telehealth for [facility].   Date:  11/04/2018   ID:  Cheryl Ballard, DOB [birth year], MRN [id number]  Patient Location: Home Provider Location: Home  PCP:  Perlie Mayo, NP  Cardiologist: Dr. Claiborne Billings  Electrophysiologist:  None   Evaluation Performed:  Follow-Up Visit  Chief Complaint:  Palpitations   History of Present Illness:    Cheryl Ballard is a 43 y.o. female we are following for ongoing assessment and management of nocturnal palpitations with feeling of skipped beats with symptoms in her chest and neck.  Other history includes hypertension.  The patient denied any symptoms of OSA.  Other history includes a slipped disc in her back with discomfort and walking.  Echocardiogram in 2018 revealed normal LVEF of 65% to 70%.  She had normal diastolic parameters.  There was no valvular abnormalities.  She was continued on Toprol-XL 50 mg daily.  She has a reported family history of paroxysmal atrial fibrillation in her mother and her brother, brother died suddenly in his 61s.  She has obesity related to weight gain due to sedentary lifestyle in the setting of chronic back pain not allowing her to be active.  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  She has not had any limitations in her work life related to palpitations.   She denies any chest pressure chest pain,dizziness, near syncope, or medication intolerance.  The patient does not have symptoms concerning for COVID-19 infection (fever, chills, cough, or new shortness of breath).  She is practicing social distancing and wearing PPE's while on duty at the hospital   Past Medical History:  Diagnosis Date  . Anxiety    cycles around  . Back pain   . Depression   . Diverticulitis   . Hypertension   . S/P colostomy ([place])    had colostomy with surgery, but has been reversed   Past Surgical History:  Procedure Laterality Date  . BOWEL RESECTION    . COLON SURGERY  04/2010  . Colostomy in April     reversal in April 2013     Current Meds  Medication Sig  . escitalopram (LEXAPRO) 20 MG tablet Take 1 tablet (20 mg total) by mouth daily.  . metoprolol succinate (TOPROL-XL) 50 MG 24 hr tablet TAKE ONE (1) TABLET BY MOUTH EVERY DAY  . [DISCONTINUED] metoprolol succinate (TOPROL-XL) 50 MG 24 hr tablet TAKE ONE (1) TABLET BY MOUTH EVERY DAY     Allergies:   Patient has no known allergies.   Social History   Tobacco Use  . Smoking status: Current Some Day Smoker    Packs/day: 0.50    Years: 23.00    Pack years: 11.50  . Smokeless tobacco: Never Used  . Tobacco comment: Patient states that she is trying to quit.  Substance Use Topics  . Alcohol use: Yes  Alcohol/week: 1.0 standard drinks    Types: 1 Cans of beer per week    Comment: occasional  . Drug use: No     Family Hx: The patient's family history includes Arthritis in her mother; Early death in her brother; Heart disease in her brother, maternal grandmother, and mother; Parkinson's disease in her mother.  ROS:   Please see the history of present illness.    *All other systems reviewed and are negative.   Prior CV studies:   The following studies were reviewed today:  Echocardiogram 07/11/2016  Left ventricle: The cavity size was normal. Wall thickness was   normal. Systolic  function was vigorous. The estimated ejection   fraction was in the range of 65% to 70%. Wall motion was normal;   there were no regional wall motion abnormalities. Left   ventricular diastolic function parameters were normal. - Right ventricle: The cavity size was normal. Wall thickness was   normal.   Labs/Other Tests and Data Reviewed:    EKG:  No ECG reviewed.  Recent Labs: 01/12/2018: Hemoglobin 13.3   Recent Lipid Panel Lab Results  Component Value Date/Time   CHOL 150 04/24/2016 10:01 AM   TRIG 94 04/24/2016 10:01 AM   HDL 61 04/24/2016 10:01 AM   CHOLHDL 2.5 04/24/2016 10:01 AM   CHOLHDL 2 05/13/2012 12:51 PM   LDLCALC 70 04/24/2016 10:01 AM    Wt Readings from Last 3 Encounters:  11/04/18 208 lb (94.3 kg)  04/21/18 208 lb (94.3 kg)  04/13/18 209 lb (94.8 kg)     Objective:    Vital Signs:  BP 138/88   Pulse 80   Ht 5\' 5"  (1.651 m)   Wt 208 lb (94.3 kg)   BMI 34.61 kg/m    VITAL SIGNS:  reviewed GEN:  no acute distress RESPIRATORY:  normal respiratory effort, symmetric expansion MUSCULOSKELETAL:  no obvious deformities. NEURO:  alert and oriented x 3, no obvious focal deficit PSYCH:  normal affect  ASSESSMENT & PLAN:    1.  Palpitations: Currently well controlled on metoprolol 50 mg XL daily.  I will give her refills of 90-day supply each.  We will have labs completed to evaluate for her current status to include BMET, CBC, vitamin D, and fasting lipids and LFTs.  She is followed by PCP in [place] and labs will be available for them on her appointment next month.  No planned ischemic testing at this time as she is asymptomatic.  2.  Anxiety and depression: Followed by PCP on Lexapro.  COVID-19 Education: The signs and symptoms of COVID-19 were discussed with the patient and how to seek care for testing (follow up with PCP or arrange E-visit).  The importance of social distancing was discussed today.  Time:   Today, I have spent 15 minutes with the  patient with telehealth technology discussing the above problems.     Medication Adjustments/Labs and Tests Ordered: Current medicines are reviewed at length with the patient today.  Concerns regarding medicines are outlined above.   Tests Ordered: Orders Placed This Encounter  Procedures  . Lipid panel  . Hepatic function panel  . Basic Metabolic Panel (BMET)  . CBC  . Vitamin D (25 hydroxy)    Medication Changes: Meds ordered this encounter  Medications  . metoprolol succinate (TOPROL-XL) 50 MG 24 hr tablet    Sig: TAKE ONE (1) TABLET BY MOUTH EVERY DAY    Dispense:  90 tablet    Refill:  3  Disposition:  Follow up 1 year with Dr. Claiborne Billings  Signed, Phill Myron. [place] Pugh, ANP, AACC  11/04/2018 10:47 AM    [facility]

## 2018-11-04 ENCOUNTER — Other Ambulatory Visit: Payer: Self-pay

## 2018-11-04 ENCOUNTER — Encounter: Payer: Self-pay | Admitting: Adult Health

## 2018-11-04 ENCOUNTER — Telehealth (INDEPENDENT_AMBULATORY_CARE_PROVIDER_SITE_OTHER): Payer: No Typology Code available for payment source | Admitting: Adult Health

## 2018-11-04 VITALS — BP 138/88 | HR 80 | Ht 65.0 in | Wt 208.0 lb

## 2018-11-04 DIAGNOSIS — Z79899 Other long term (current) drug therapy: Secondary | ICD-10-CM | POA: Diagnosis not present

## 2018-11-04 DIAGNOSIS — R002 Palpitations: Secondary | ICD-10-CM

## 2018-11-04 DIAGNOSIS — Z1321 Encounter for screening for nutritional disorder: Secondary | ICD-10-CM

## 2018-11-04 DIAGNOSIS — Z13220 Encounter for screening for lipoid disorders: Secondary | ICD-10-CM

## 2018-11-04 MED ORDER — METOPROLOL SUCCINATE ER 50 MG PO TB24: ORAL_TABLET | ORAL | 3 refills | Status: DC

## 2018-11-04 NOTE — Patient Instructions (Signed)
Medication Instructions:  Continue current medications  If you need a refill on your cardiac medications before your next appointment, please call your pharmacy.  Labwork: BMP, CBC, Fasting Lipid and Liver, Vit D HERE IN OUR OFFICE AT [facility]   You will need to fast. DO NOT EAT OR DRINK PAST MIDNIGHT.       Take the provided lab slips with you to the lab for your blood draw.   When you have your labs (blood work) drawn today and your tests are completely normal, you will receive your results only by MyChart Message (if you have MyChart) -OR-  A paper copy in the mail.  If you have any lab test that is abnormal or we need to change your treatment, we will call you to review these results.  Testing/Procedures: None Ordered  Follow-Up: You will need a follow up appointment in 1 Year.  Please call our office 2 months in advance to schedule this appointment.  You may see Dr Claiborne Billings or one of the following Advanced Practice Providers on your designated Care Team: Almyra Deforest, [place] . Fabian Sharp, PA-C     At [facility], you and your health needs are our priority.  As part of our continuing mission to provide you with exceptional heart care, we have created designated Provider Care Teams.  These Care Teams include your primary Cardiologist (physician) and Advanced Practice Providers (APPs -  Physician Assistants and Nurse Practitioners) who all work together to provide you with the care you need, when you need it.  Thank you for choosing [facility] at [facility]!!

## 2018-12-22 ENCOUNTER — Other Ambulatory Visit: Payer: Self-pay

## 2018-12-22 ENCOUNTER — Encounter: Payer: Self-pay | Admitting: Family Medicine

## 2018-12-22 ENCOUNTER — Ambulatory Visit (INDEPENDENT_AMBULATORY_CARE_PROVIDER_SITE_OTHER): Payer: No Typology Code available for payment source | Admitting: Family Medicine

## 2018-12-22 DIAGNOSIS — F329 Major depressive disorder, single episode, unspecified: Secondary | ICD-10-CM | POA: Diagnosis not present

## 2018-12-22 DIAGNOSIS — F418 Other specified anxiety disorders: Secondary | ICD-10-CM | POA: Diagnosis not present

## 2018-12-22 DIAGNOSIS — F32A Depression, unspecified: Secondary | ICD-10-CM

## 2018-12-22 MED ORDER — ESCITALOPRAM OXALATE 20 MG PO TABS: 20.0000 mg | ORAL_TABLET | Freq: Every day | ORAL | 1 refills | Status: DC

## 2018-12-22 NOTE — Progress Notes (Signed)
Virtual Visit via Telephone Note   This visit type was conducted due to national recommendations for restrictions regarding the COVID-19 Pandemic (e.g. social distancing) in an effort to limit this patient's exposure and mitigate transmission in our community.  Due to her co-morbid illnesses, this patient is at least at moderate risk for complications without adequate follow up.  This format is felt to be most appropriate for this patient at this time.  The patient did not have access to video technology/had technical difficulties with video requiring transitioning to audio format only (telephone).  All issues noted in this document were discussed and addressed.  No physical exam could be performed with this format.    Evaluation Performed:  Follow-up visit  Date:  12/22/2018   ID:  Cheryl Ballard, DOB [birth year], MRN [id number]  Patient Location: Home Provider Location: Office  Location of Patient: Home Location of Provider: Telehealth Consent was obtain for visit to be over via telehealth. I verified that I am speaking with the correct person using two identifiers.  PCP:  Perlie Mayo, NP   Chief Complaint:    History of Present Illness:    Cheryl Ballard is a 43 y.o. female with history of anxiety, depression, back pain, hypertension.  Reports today that since increasing her Lexapro to 20 mg she has been feeling much better.  Does not have any complaints today.  Would like to stay on the 20 mg for now.  Mother is still living with them and they are having a mother-in-law suite built behind her house this will help to reduce any stress that she currently has secondary to the new living situation.  As her mom still needs to have her own home at home living space.  But overall she is much more happier with her mom being with her. Reports taking her medication without any issue.  Would like to do the 45-month supply from here on out.  The patient does not have symptoms concerning for  COVID-19 infection (fever, chills, cough, or new shortness of breath).   Past Medical, Surgical, Social History, Allergies, and Medications have been Reviewed.   Past Medical History:  Diagnosis Date  . Anxiety    cycles around  . Back pain   . Depression   . Diverticulitis   . Hypertension   . S/P colostomy ([place])    had colostomy with surgery, but has been reversed   Past Surgical History:  Procedure Laterality Date  . BOWEL RESECTION    . COLON SURGERY  04/2010  . Colostomy in April     reversal in April 2013     Current Meds  Medication Sig  . escitalopram (LEXAPRO) 20 MG tablet Take 1 tablet (20 mg total) by mouth daily.  . metoprolol succinate (TOPROL-XL) 50 MG 24 hr tablet TAKE ONE (1) TABLET BY MOUTH EVERY DAY  . [DISCONTINUED] escitalopram (LEXAPRO) 20 MG tablet Take 1 tablet (20 mg total) by mouth daily.     Allergies:   Patient has no known allergies.   Social History   Tobacco Use  . Smoking status: Current Some Day Smoker    Packs/day: 0.50    Years: 23.00    Pack years: 11.50  . Smokeless tobacco: Never Used  . Tobacco comment: Patient states that she is trying to quit.  Substance Use Topics  . Alcohol use: Yes    Alcohol/week: 1.0 standard drinks    Types: 1 Cans of beer per week  Comment: occasional  . Drug use: No     Family Hx: The patient's family history includes Arthritis in her mother; Early death in her brother; Heart disease in her brother, maternal grandmother, and mother; Parkinson's disease in her mother.  ROS:   Please see the history of present illness.    All other systems reviewed and are negative.   Labs/Other Tests and Data Reviewed:    Recent Labs: 01/12/2018: Hemoglobin 13.3   Recent Lipid Panel Lab Results  Component Value Date/Time   CHOL 150 04/24/2016 10:01 AM   TRIG 94 04/24/2016 10:01 AM   HDL 61 04/24/2016 10:01 AM   CHOLHDL 2.5 04/24/2016 10:01 AM   CHOLHDL 2 05/13/2012 12:51 PM   LDLCALC 70 04/24/2016  10:01 AM    Wt Readings from Last 3 Encounters:  11/04/18 208 lb (94.3 kg)  04/21/18 208 lb (94.3 kg)  04/13/18 209 lb (94.8 kg)     Objective:    Vital Signs:  There were no vitals taken for this visit.   GEN:  Alert and oriented RESPIRATORY:  No shortness of breath noted in conversation PSYCH:  Normal affect and mood, good communication pleasant   GAD 7 : Generalized Anxiety Score 12/22/2018 09/21/2018  Nervous, Anxious, on Edge 1 1  Control/stop worrying 0 0  Worry too much - different things 0 1  Trouble relaxing 0 0  Restless 0 0  Easily annoyed or irritable 2 0  Afraid - awful might happen 0 0  Total GAD 7 Score 3 2  Anxiety Difficulty Somewhat difficult Not difficult at all   Depression screen [facility] 2/9 12/22/2018 09/21/2018 04/21/2018  Decreased Interest 0 1 1  Down, Depressed, Hopeless 0 0 1  PHQ - 2 Score 0 1 2  Altered sleeping - 0 1  Tired, decreased energy - 1 1  Change in appetite - 0 2  Feeling bad or failure about yourself  - 0 0  Trouble concentrating - 0 0  Moving slowly or fidgety/restless - 0 0  Suicidal thoughts - 0 0  PHQ-9 Score - 2 6  Difficult doing work/chores - Not difficult at all Somewhat difficult      ASSESSMENT & PLAN:    1. Situational anxiety Much improved, in remission not fully resolved.  Is doing much better with handling things with the increased dose of Lexapro.  GAD has reduced.  Still reports that she has had some anxiety with dealing with the new living situation but overall she is much happier.  We will continue the 20 mg of Lexapro at this time.  Will provide 16-month supply.  - escitalopram (LEXAPRO) 20 MG tablet; Take 1 tablet (20 mg total) by mouth daily.  Dispense: 90 tablet; Refill: 1  2. Depression, unspecified depression type Resolved, PHQ-9 is 0 that is improved tremendously over the last couple months.  Overall she reports that she is doing much better when she does feel something acutely just anxiety driven not  depression.    Time:   Today, I have spent 10 minutes with the patient with telehealth technology discussing the above problems.     Medication Adjustments/Labs and Tests Ordered: Current medicines are reviewed at length with the patient today.  Concerns regarding medicines are outlined above.   Tests Ordered: No orders of the defined types were placed in this encounter.   Medication Changes: Meds ordered this encounter  Medications  . escitalopram (LEXAPRO) 20 MG tablet    Sig: Take 1 tablet (20  mg total) by mouth daily.    Dispense:  90 tablet    Refill:  1    Order Specific Question:   Supervising Provider    Answer:   Fayrene Helper [id number]    Disposition:  Follow up 3-4 months annual visit  Signed, Perlie Mayo, NP  12/22/2018 10:42 AM     [place]

## 2018-12-22 NOTE — Patient Instructions (Addendum)
  I appreciate the opportunity to provide you with the care for your health and wellness. Today we discussed: Anxiety and depression  Follow up: Follow-up in January February for annual visit CPE no Pap  No labs or referrals today  I am so happy to be here that your anxiety is much better controlled.  It seems that your depression has  resolved.  I have noted this in your chart.  We decided to continue to take the Lexapro 20 mg if you ever decide that you want to reduce and or wean off please let me know we will do that safely for you.  I hope that you have a happy wonderful safe and healthy holiday season.  Please do not hesitate to reach out if you need me before your next appointment.  Please continue to practice social distancing to keep you, your family, and our community safe.  If you must go out, please wear a mask and practice good handwashing.  It was a pleasure to see you and I look forward to continuing to work together on your health and well-being. Please do not hesitate to call the office if you need care or have questions about your care.  Have a wonderful day and week. With Gratitude, [name], DNP, AGNP-BC

## 2019-03-29 ENCOUNTER — Other Ambulatory Visit (HOSPITAL_COMMUNITY): Payer: Self-pay | Admitting: Women's Health

## 2019-03-29 ENCOUNTER — Encounter: Payer: No Typology Code available for payment source | Admitting: Family Medicine

## 2019-03-29 DIAGNOSIS — Z1231 Encounter for screening mammogram for malignant neoplasm of breast: Secondary | ICD-10-CM

## 2019-04-06 ENCOUNTER — Telehealth: Payer: Self-pay | Admitting: Adult Health

## 2019-04-06 ENCOUNTER — Other Ambulatory Visit: Payer: Self-pay

## 2019-04-06 ENCOUNTER — Ambulatory Visit (HOSPITAL_COMMUNITY)
Admission: RE | Admit: 2019-04-06 | Discharge: 2019-04-06 | Disposition: A | Discharge status: Home / Self Care | Payer: No Typology Code available for payment source | Source: Ambulatory Visit | Attending: Women's Health | Admitting: Women's Health

## 2019-04-06 DIAGNOSIS — Z1231 Encounter for screening mammogram for malignant neoplasm of breast: Secondary | ICD-10-CM | POA: Insufficient documentation

## 2019-04-06 NOTE — Telephone Encounter (Signed)
Pt aware that mammogram suggests mass in right breast needs further images, left breast normal and they will contact for appt.

## 2019-04-07 ENCOUNTER — Other Ambulatory Visit (HOSPITAL_COMMUNITY): Payer: Self-pay | Admitting: Women's Health

## 2019-04-07 DIAGNOSIS — R928 Other abnormal and inconclusive findings on diagnostic imaging of breast: Secondary | ICD-10-CM

## 2019-04-12 ENCOUNTER — Ambulatory Visit (HOSPITAL_COMMUNITY)
Admission: RE | Admit: 2019-04-12 | Discharge: 2019-04-12 | Disposition: A | Discharge status: Home / Self Care | Payer: No Typology Code available for payment source | Source: Ambulatory Visit | Attending: Women's Health | Admitting: Women's Health

## 2019-04-12 ENCOUNTER — Other Ambulatory Visit: Payer: Self-pay

## 2019-04-12 ENCOUNTER — Ambulatory Visit: Payer: No Typology Code available for payment source | Admitting: Family Medicine

## 2019-04-12 ENCOUNTER — Telehealth: Payer: Self-pay | Admitting: Adult Health

## 2019-04-12 DIAGNOSIS — R928 Other abnormal and inconclusive findings on diagnostic imaging of breast: Secondary | ICD-10-CM | POA: Diagnosis not present

## 2019-04-12 NOTE — Telephone Encounter (Signed)
Left message that mammogram showed fibroglandular tissue and repeat in 6 months

## 2019-05-18 ENCOUNTER — Other Ambulatory Visit: Payer: No Typology Code available for payment source | Admitting: Women's Health

## 2019-06-21 ENCOUNTER — Other Ambulatory Visit: Payer: Self-pay

## 2019-06-21 ENCOUNTER — Encounter: Payer: Self-pay | Admitting: Family Medicine

## 2019-06-21 ENCOUNTER — Ambulatory Visit (INDEPENDENT_AMBULATORY_CARE_PROVIDER_SITE_OTHER): Payer: No Typology Code available for payment source | Admitting: Family Medicine

## 2019-06-21 VITALS — BP 122/90 | HR 64 | Temp 97.5°F | Resp 15 | Ht 65.0 in | Wt 187.0 lb

## 2019-06-21 DIAGNOSIS — M898X7 Other specified disorders of bone, ankle and foot: Secondary | ICD-10-CM

## 2019-06-21 DIAGNOSIS — R002 Palpitations: Secondary | ICD-10-CM

## 2019-06-21 DIAGNOSIS — F17200 Nicotine dependence, unspecified, uncomplicated: Secondary | ICD-10-CM

## 2019-06-21 DIAGNOSIS — R221 Localized swelling, mass and lump, neck: Secondary | ICD-10-CM

## 2019-06-21 DIAGNOSIS — F329 Major depressive disorder, single episode, unspecified: Secondary | ICD-10-CM

## 2019-06-21 DIAGNOSIS — F32A Depression, unspecified: Secondary | ICD-10-CM

## 2019-06-21 DIAGNOSIS — M79671 Pain in right foot: Secondary | ICD-10-CM | POA: Insufficient documentation

## 2019-06-21 DIAGNOSIS — Z0001 Encounter for general adult medical examination with abnormal findings: Secondary | ICD-10-CM | POA: Diagnosis not present

## 2019-06-21 DIAGNOSIS — F418 Other specified anxiety disorders: Secondary | ICD-10-CM | POA: Diagnosis not present

## 2019-06-21 MED ORDER — ESCITALOPRAM OXALATE 20 MG PO TABS: 20.0000 mg | ORAL_TABLET | Freq: Every day | ORAL | 1 refills | Status: DC

## 2019-06-21 NOTE — Assessment & Plan Note (Signed)
Discussed monthly self breast exams and yearly mammograms; at least 30 minutes of aerobic activity at least 5 days/week and weight-bearing exercise 2x/week; proper sunscreen use reviewed; healthy diet, including goals of calcium and vitamin D intake and alcohol recommendations (less than or equal to 1 drink/day) reviewed; regular seatbelt use; changing batteries in smoke detectors.  Immunization recommendations discussed.  Colonoscopy recommendations reviewed.  

## 2019-06-21 NOTE — Assessment & Plan Note (Signed)
Reports improvement-continue lexapro Denies SI and HI

## 2019-06-21 NOTE — Patient Instructions (Addendum)
I appreciate the opportunity to provide you with care for your health and wellness. Today we discussed: overall health   Follow up: 6 months -recheck of right side of neck  Labs fasting at [employer] [facility]  No referrals today, but if foot keeps hurting we can do podiatry.   HAVE A GREAT TRIP~~STAY SAFE :)  Please continue to practice social distancing to keep you, your family, and our community safe.  If you must go out, please wear a mask and practice good handwashing.  It was a pleasure to see you and I look forward to continuing to work together on your health and well-being. Please do not hesitate to call the office if you need care or have questions about your care.  Have a wonderful day and week. With Gratitude, [name], DNP, AGNP-BC  HEALTH MAINTENANCE RECOMMENDATIONS:  It is recommended that you get at least 30 minutes of aerobic exercise at least 5 days/week (for weight loss, you may need as much as 60-90 minutes). This can be any activity that gets your heart rate up. This can be divided in 10-15 minute intervals if needed, but try and build up your endurance at least once a week.  Weight bearing exercise is also recommended twice weekly.  Eat a healthy diet with lots of vegetables, fruits and fiber.  "Colorful" foods have a lot of vitamins (ie green vegetables, tomatoes, red peppers, etc).  Limit sweet tea, regular sodas and alcoholic beverages, all of which has a lot of calories and sugar.  Up to 1 alcoholic drink daily may be beneficial for women (unless trying to lose weight, watch sugars).  Drink a lot of water.  Calcium recommendations are 1200-1500 mg daily (1500 mg for postmenopausal women or women without ovaries), and vitamin D 1000 IU daily.  This should be obtained from diet and/or supplements (vitamins), and calcium should not be taken all at once, but in divided doses.  Monthly self breast exams and yearly mammograms for women over the age of 34 is  recommended.  Sunscreen of at least SPF 30 should be used on all sun-exposed parts of the skin when outside between the hours of 10 am and 4 pm (not just when at beach or pool, but even with exercise, golf, tennis, and yard work!)  Use a sunscreen that says "broad spectrum" so it covers both UVA and UVB rays, and make sure to reapply every 1-2 hours.  Remember to change the batteries in your smoke detectors when changing your clock times in the spring and fall.  Use your seat belt every time you are in a car, and please drive safely and not be distracted with cell phones and texting while driving.

## 2019-06-21 NOTE — Assessment & Plan Note (Signed)
Improved, continue lexapro as current dose. Denies SI or HI

## 2019-06-21 NOTE — Assessment & Plan Note (Signed)
Social smoker -1-2 pk a week  Asked about quitting: confirms they are currently smokes cigarettes Advise to quit smoking: Educated about QUITTING to reduce the risk of cancer, cardio and cerebrovascular disease. Assess willingness: Unwilling to quit at this time, but is working on cutting back. Assist with counseling and pharmacotherapy: Counseled for 5 minutes and literature provided. Arrange for follow up: not quitting follow up in 3 months and continue to offer help.

## 2019-06-21 NOTE — Progress Notes (Addendum)
Health Maintenance reviewed -  Immunization History  Administered Date(s) Administered  . PFIZER SARS-COV-2 Vaccination 02/27/2019, 03/21/2019    Last Pap smear: [facility] 06/28/2019 appt up coming Last mammogram: 04/2019  Last colonoscopy: Waiting until 43  Last DEXA: n/a  Dentist: April 2021-twice yearly  Ophtho: July 2021 up coming appt-glasses and contacts Exercise: walking 3 days a week   Other doctors caring for patient include:  Patient Care Team: Perlie Mayo, NP as PCP - General (Family Medicine) Sinda Du, MD (Internal Medicine)  End of Life Discussion:  Patient does not have a living will and medical power of attorney    Code Status: Not on file   Subjective:   HPI  Cheryl Ballard is a 44 y.o. female who presents for annual wellness visit and follow-up on chronic medical conditions.  She has the following concerns: Only concern today is top of Right foot is bothersome, wonders if it was an old stress fx giving trouble. She also reports lateral foot pain, but this is the same side she has nerve pains due to two bugling disc and one ruptured. Followed by Dr Dr Rita Ohara- does not want sx yet.   Denies any other issues or concerns. Reports she is going on vacation later this week to [place] with 18 friends.    Review Of Systems  Review of Systems  HENT: Negative.   Eyes: Negative.   Respiratory: Negative.   Cardiovascular: Negative.   Gastrointestinal: Negative.   Endocrine: Negative.   Genitourinary: Negative.   Musculoskeletal:       Foot pains  Skin: Negative.   Allergic/Immunologic: Negative.   Neurological: Negative.   Hematological: Negative.   Psychiatric/Behavioral: Negative.   All other systems reviewed and are negative.   Objective:   PHYSICAL EXAM:  BP 122/90   Pulse 64   Temp (!) 97.5 F (36.4 C) (Temporal)   Resp 15   Ht 5\' 5"  (1.651 m)   Wt 187 lb (84.8 kg)   SpO2 97%   BMI 31.12 kg/m   Physical Exam Vitals and  nursing note reviewed.  Constitutional:      General: She is awake.     Appearance: Normal appearance. She is well-developed, well-groomed and overweight.  HENT:     Head: Normocephalic and atraumatic.     Right Ear: Hearing, tympanic membrane, ear canal and external ear normal.     Left Ear: Hearing, tympanic membrane, ear canal and external ear normal.     Nose: Nose normal.     Mouth/Throat:     Lips: Pink.     Mouth: Mucous membranes are moist.     Pharynx: Oropharynx is clear.  Eyes:     General: Lids are normal.     Extraocular Movements: Extraocular movements intact.     Conjunctiva/sclera: Conjunctivae normal.     Pupils: Pupils are equal, round, and reactive to light.  Neck:     Thyroid: No thyroid mass, thyromegaly or thyroid tenderness.     Trachea: Trachea normal.     Comments: Fullness of the Right neck-around sternocleidomastoid  -lipoma feeling in nature.  Cardiovascular:     Rate and Rhythm: Normal rate and regular rhythm.     Pulses: Normal pulses.          Radial pulses are 2+ on the right side and 2+ on the left side.       Dorsalis pedis pulses are 2+ on the right side and 2+ on  the left side.     Heart sounds: Normal heart sounds.  Pulmonary:     Effort: Pulmonary effort is normal.     Breath sounds: Normal breath sounds and air entry.  Abdominal:     General: Abdomen is flat. Bowel sounds are normal.     Palpations: Abdomen is soft.     Tenderness: There is no abdominal tenderness.  Musculoskeletal:        General: Normal range of motion.     Cervical back: Normal range of motion and neck supple.     Right lower leg: No edema.     Left lower leg: No edema.  Lymphadenopathy:     Cervical: No cervical adenopathy.  Skin:    General: Skin is warm and dry.     Capillary Refill: Capillary refill takes less than 2 seconds.  Neurological:     General: No focal deficit present.     Mental Status: She is alert and oriented to person, place, and time. Mental  status is at baseline.     Cranial Nerves: Cranial nerves are intact.     Sensory: Sensation is intact.     Motor: Motor function is intact.     Coordination: Coordination is intact.     Gait: Gait is intact.     Deep Tendon Reflexes: Reflexes are normal and symmetric.  Psychiatric:        Attention and Perception: Attention and perception normal.        Mood and Affect: Mood and affect normal.        Speech: Speech normal.        Behavior: Behavior normal. Behavior is cooperative.        Thought Content: Thought content normal.        Cognition and Memory: Cognition and memory normal.        Judgment: Judgment normal.    Depression Screening  Depression screen [facility] 2/9 06/21/2019 12/22/2018 09/21/2018 04/21/2018  Decreased Interest 0 0 1 1  Down, Depressed, Hopeless 0 0 0 1  PHQ - 2 Score 0 0 1 2  Altered sleeping 0 - 0 1  Tired, decreased energy 0 - 1 1  Change in appetite 0 - 0 2  Feeling bad or failure about yourself  0 - 0 0  Trouble concentrating 0 - 0 0  Moving slowly or fidgety/restless 0 - 0 0  Suicidal thoughts 0 - 0 0  PHQ-9 Score 0 - 2 6  Difficult doing work/chores Not difficult at all - Not difficult at all Somewhat difficult     Assessment & Plan:   1. Annual visit for general adult medical examination with abnormal findings   2. Situational anxiety   3. Heart palpitations   4. Smoker   5. Depression, unspecified depression type   6. Pain in metatarsus of right foot   7. Fullness of neck     Tests ordered Orders Placed This Encounter  Procedures  . CBC  . Hemoglobin A1c  . Lipid panel  . TSH  . VITAMIN D 25 Hydroxy (Vit-D Deficiency, Fractures)  . Comprehensive Metabolic Panel (CMET)     Plan: Please see assessment and plan per problem list above.   Meds ordered this encounter  Medications  . escitalopram (LEXAPRO) 20 MG tablet    Sig: Take 1 tablet (20 mg total) by mouth daily.    Dispense:  90 tablet    Refill:  1    Order Specific  Question:  Supervising Provider    Answer:   Fayrene Helper [id number]       The patient's weight, height, BMI, and visual acuity have been recorded in the chart.  I have made referrals, counseling, and provided education to the patient based on review of the above and I have provided the patient with a written personalized care plan for preventive services.     Perlie Mayo, NP   06/21/2019

## 2019-06-21 NOTE — Assessment & Plan Note (Signed)
Followed by Cards, reports doing well as long as she takes her Metoprolol. Encouraged monitoring of caffeine and increased exercise.

## 2019-06-21 NOTE — Assessment & Plan Note (Signed)
Questionable fatty tissue/lipoma vs thyroid nod or growth. Will reassess in 6 months time. [place] if needed or change before.

## 2019-06-21 NOTE — Assessment & Plan Note (Signed)
Encouraged her to use supportive shoes with good arch. If pain continues can do referral to podiatry.

## 2019-06-27 ENCOUNTER — Other Ambulatory Visit: Payer: No Typology Code available for payment source | Admitting: Women's Health

## 2019-06-28 ENCOUNTER — Other Ambulatory Visit: Payer: No Typology Code available for payment source | Admitting: Women's Health

## 2019-12-01 ENCOUNTER — Ambulatory Visit: Payer: No Typology Code available for payment source | Attending: Internal Medicine

## 2019-12-01 DIAGNOSIS — Z23 Encounter for immunization: Secondary | ICD-10-CM

## 2019-12-01 NOTE — Progress Notes (Signed)
° °  Covid-19 Vaccination Clinic  Name:  Cheryl Ballard    MRN: [id number] DOB: Jul 26, 1975  12/01/2019  Cheryl Ballard was observed post Covid-19 immunization for 15 minutes without incident. She was provided with Vaccine Information Sheet and instruction to access the V-Safe system.   Cheryl Ballard was instructed to call 911 with any severe reactions post vaccine:  Difficulty breathing   Swelling of face and throat   A fast heartbeat   A bad rash all over body   Dizziness and weakness

## 2019-12-17 ENCOUNTER — Other Ambulatory Visit: Payer: Self-pay | Admitting: Adult Health

## 2019-12-21 ENCOUNTER — Ambulatory Visit: Payer: No Typology Code available for payment source | Admitting: Family Medicine

## 2020-01-31 ENCOUNTER — Other Ambulatory Visit: Payer: Self-pay

## 2020-01-31 ENCOUNTER — Encounter: Payer: Self-pay | Admitting: Family Medicine

## 2020-01-31 MED ORDER — METOPROLOL SUCCINATE ER 50 MG PO TB24: ORAL_TABLET | ORAL | 1 refills | Status: DC

## 2020-04-05 ENCOUNTER — Other Ambulatory Visit: Payer: Self-pay | Admitting: Family Medicine

## 2020-04-05 DIAGNOSIS — F418 Other specified anxiety disorders: Secondary | ICD-10-CM

## 2020-06-04 ENCOUNTER — Encounter: Payer: Self-pay | Admitting: Family Medicine

## 2020-06-27 ENCOUNTER — Encounter (INDEPENDENT_AMBULATORY_CARE_PROVIDER_SITE_OTHER): Payer: Self-pay | Admitting: *Deleted

## 2020-06-27 ENCOUNTER — Encounter: Payer: Self-pay | Admitting: Women's Health

## 2020-06-27 ENCOUNTER — Ambulatory Visit (INDEPENDENT_AMBULATORY_CARE_PROVIDER_SITE_OTHER): Payer: No Typology Code available for payment source | Admitting: Women's Health

## 2020-06-27 ENCOUNTER — Other Ambulatory Visit (HOSPITAL_COMMUNITY)
Admission: RE | Admit: 2020-06-27 | Discharge: 2020-06-27 | Disposition: A | Discharge status: Home / Self Care | Payer: No Typology Code available for payment source | Source: Ambulatory Visit | Attending: Obstetrics & Gynecology | Admitting: Obstetrics & Gynecology

## 2020-06-27 ENCOUNTER — Other Ambulatory Visit: Payer: Self-pay

## 2020-06-27 VITALS — BP 122/88 | HR 73 | Ht 65.0 in | Wt 220.5 lb

## 2020-06-27 DIAGNOSIS — Z01419 Encounter for gynecological examination (general) (routine) without abnormal findings: Secondary | ICD-10-CM | POA: Diagnosis not present

## 2020-06-27 DIAGNOSIS — Z13220 Encounter for screening for lipoid disorders: Secondary | ICD-10-CM

## 2020-06-27 DIAGNOSIS — Z131 Encounter for screening for diabetes mellitus: Secondary | ICD-10-CM

## 2020-06-27 DIAGNOSIS — N926 Irregular menstruation, unspecified: Secondary | ICD-10-CM

## 2020-06-27 DIAGNOSIS — Z1329 Encounter for screening for other suspected endocrine disorder: Secondary | ICD-10-CM | POA: Diagnosis not present

## 2020-06-27 DIAGNOSIS — Z1321 Encounter for screening for nutritional disorder: Secondary | ICD-10-CM

## 2020-06-27 DIAGNOSIS — Z1211 Encounter for screening for malignant neoplasm of colon: Secondary | ICD-10-CM

## 2020-06-27 DIAGNOSIS — Z1212 Encounter for screening for malignant neoplasm of rectum: Secondary | ICD-10-CM

## 2020-06-27 DIAGNOSIS — Z1231 Encounter for screening mammogram for malignant neoplasm of breast: Secondary | ICD-10-CM

## 2020-06-27 NOTE — Progress Notes (Signed)
WELL-WOMAN EXAMINATION Patient name: Cheryl Ballard MRN [id number]  Date of birth: Jan 31, 1976 Chief Complaint:   Gynecologic Exam (Wants fasting labs)  History of Present Illness:   Cheryl Ballard is a 45 y.o. G2P0020 Caucasian female being seen today for a routine well-woman exam.  Current complaints: periods irregular for past 2-3 years, period q 2-53mths. +hot flashes, vaginal dryness, mood swings. Mom had hysterectomy w/ BSO in 43s. Does smoke.   Depression screen [facility] 2/9 06/27/2020 06/21/2019 12/22/2018 09/21/2018 04/21/2018  Decreased Interest 0 0 0 1 1  Down, Depressed, Hopeless 0 0 0 0 1  PHQ - 2 Score 0 0 0 1 2  Altered sleeping 0 0 - 0 1  Tired, decreased energy 1 0 - 1 1  Change in appetite 1 0 - 0 2  Feeling bad or failure about yourself  0 0 - 0 0  Trouble concentrating 0 0 - 0 0  Moving slowly or fidgety/restless 0 0 - 0 0  Suicidal thoughts 0 0 - 0 0  PHQ-9 Score 2 0 - 2 6  Difficult doing work/chores - Not difficult at all - Not difficult at all Somewhat difficult     PCP: [name], NP      does desire labs No LMP recorded. (Menstrual status: Irregular Periods). The current method of family planning is none.  Last pap 04/09/16. Results were: NILM w/ HRHPV negative. H/O abnormal pap: no Last mammogram: 04/12/19. Results were: inner Rt breast asymmetry, likely an [place] of normal fibroglandular tissue, bi-rads 3, recommended 66mth f/u. Family h/o breast cancer: yes MGA Last colonoscopy: never. Results were: N/A. Family h/o colorectal cancer: no Review of Systems:   Pertinent items are noted in HPI Denies any headaches, blurred vision, fatigue, shortness of breath, chest pain, abdominal pain, abnormal vaginal discharge/itching/odor/irritation, problems with periods, bowel movements, urination, or intercourse unless otherwise stated above. Pertinent History Reviewed:  Reviewed past medical,surgical, social and family history.  Reviewed problem list, medications and  allergies. Physical Assessment:   Vitals:   06/27/20 0836  BP: 122/88  Pulse: 73  Weight: 220 lb 8 oz (100 kg)  Height: 5\' 5"  (1.651 m)  Body mass index is 36.69 kg/m.        Physical Examination:   General appearance - well appearing, and in no distress  Mental status - alert, oriented to person, place, and time  Psych:  She has a normal mood and affect  Skin - warm and dry, normal color, no suspicious lesions noted  Chest - effort normal, all lung fields clear to auscultation bilaterally  Heart - normal rate and regular rhythm  Neck:  midline trachea, no thyromegaly or nodules  Breasts - breasts appear normal, no suspicious masses, no skin or nipple changes or  axillary nodes  Abdomen - soft, nontender, nondistended, no masses or organomegaly  Pelvic - VULVA: normal appearing vulva with no masses, tenderness or lesions  VAGINA: normal appearing vagina with normal color and discharge, no lesions  CERVIX: normal appearing cervix without discharge or lesions, no CMT  Thin prep pap is done w/ HR HPV cotesting  UTERUS: uterus is felt to be normal size, shape, consistency and nontender   ADNEXA: No adnexal masses or tenderness noted.  Extremities:  No swelling or varicosities noted  Chaperone: pt declined    No results found for this or any previous visit (from the past 24 hour(s)).  Assessment & Plan:  1) Well-Woman Exam  2) Likely peri-menopausal> irregular periods q  2-54mths, hot flashes, vaginal dryness, mood swings. Will check FSH  3) H/O abnormal mammogram> ordered f/u today  4) Past due for TCS> GI referral to Dr. Dereck Leep entered  Labs/procedures today: pap, labs  Mammogram: ordered today Colonoscopy: schedule screening colonoscopy as soon as possible- referral placed  Orders Placed This Encounter  Procedures  . MS DIGITAL SCREENING TOMO BILATERAL  . CBC  . Comprehensive metabolic panel  . TSH  . Lipid panel  . Hemoglobin A1c  . VITAMIN D 25 Hydroxy (Vit-D  Deficiency, Fractures)  . Follicle stimulating hormone  . Ambulatory referral to Gastroenterology    Meds: No orders of the defined types were placed in this encounter.   Follow-up: Return in about 1 year (around 06/27/2021) for Physical.  [place], WHNP-BC 06/27/2020 1:51 PM

## 2020-06-28 LAB — COMPREHENSIVE METABOLIC PANEL
ALT: 21 IU/L (ref 0–32)
AST: 18 IU/L (ref 0–40)
Albumin/Globulin Ratio: 2 (ref 1.2–2.2)
Albumin: 4.5 g/dL (ref 3.8–4.8)
Alkaline Phosphatase: 81 IU/L (ref 44–121)
BUN/Creatinine Ratio: 17 (ref 9–23)
BUN: 16 mg/dL (ref 6–24)
Bilirubin Total: 0.3 mg/dL (ref 0.0–1.2)
CO2: 21 mmol/L (ref 20–29)
Calcium: 9.6 mg/dL (ref 8.7–10.2)
Chloride: 103 mmol/L (ref 96–106)
Creatinine, Ser: 0.93 mg/dL (ref 0.57–1.00)
Globulin, Total: 2.3 g/dL (ref 1.5–4.5)
Glucose: 101 mg/dL — ABNORMAL HIGH (ref 65–99)
Potassium: 4.7 mmol/L (ref 3.5–5.2)
Sodium: 140 mmol/L (ref 134–144)
Total Protein: 6.8 g/dL (ref 6.0–8.5)
eGFR: 76 mL/min/{1.73_m2} (ref >59)

## 2020-06-28 LAB — CBC
Hematocrit: 44.4 % (ref 34.0–46.6)
Hemoglobin: 15.3 g/dL (ref 11.1–15.9)
MCH: 31.3 pg (ref 26.6–33.0)
MCHC: 34.5 g/dL (ref 31.5–35.7)
MCV: 91 fL (ref 79–97)
Platelets: 271 10*3/uL (ref 150–450)
RBC: 4.89 x10E6/uL (ref 3.77–5.28)
RDW: 12.6 % (ref 11.7–15.4)
WBC: 9.4 10*3/uL (ref 3.4–10.8)

## 2020-06-28 LAB — HEMOGLOBIN A1C
Est. average glucose Bld gHb Est-mCnc: 111 mg/dL
Hgb A1c MFr Bld: 5.5 % (ref 4.8–5.6)

## 2020-06-28 LAB — LIPID PANEL
Chol/HDL Ratio: 3.7 ratio (ref 0.0–4.4)
Cholesterol, Total: 179 mg/dL (ref 100–199)
HDL: 49 mg/dL (ref >39)
LDL Chol Calc (NIH): 110 mg/dL — ABNORMAL HIGH (ref 0–99)
Triglycerides: 111 mg/dL (ref 0–149)
VLDL Cholesterol Cal: 20 mg/dL (ref 5–40)

## 2020-06-28 LAB — TSH: TSH: 2.57 u[IU]/mL (ref 0.450–4.500)

## 2020-06-28 LAB — FOLLICLE STIMULATING HORMONE: FSH: 45.6 m[IU]/mL

## 2020-06-28 LAB — VITAMIN D 25 HYDROXY (VIT D DEFICIENCY, FRACTURES): Vit D, 25-Hydroxy: 22.1 ng/mL — ABNORMAL LOW (ref 30.0–100.0)

## 2020-06-29 LAB — CYTOLOGY - PAP
Adequacy: ABSENT
Comment: NEGATIVE
Diagnosis: NEGATIVE
High risk HPV: NEGATIVE

## 2020-07-23 ENCOUNTER — Other Ambulatory Visit: Payer: Self-pay | Admitting: Women's Health

## 2020-07-23 DIAGNOSIS — N6310 Unspecified lump in the right breast, unspecified quadrant: Secondary | ICD-10-CM

## 2020-08-23 ENCOUNTER — Other Ambulatory Visit: Payer: Self-pay | Admitting: Cardiovascular Disease

## 2020-09-20 ENCOUNTER — Other Ambulatory Visit: Payer: Self-pay

## 2020-09-20 ENCOUNTER — Other Ambulatory Visit (INDEPENDENT_AMBULATORY_CARE_PROVIDER_SITE_OTHER): Payer: Self-pay

## 2020-09-20 DIAGNOSIS — Z1211 Encounter for screening for malignant neoplasm of colon: Secondary | ICD-10-CM

## 2020-09-20 MED ORDER — METOPROLOL SUCCINATE ER 50 MG PO TB24: ORAL_TABLET | ORAL | 0 refills | Status: DC

## 2020-10-11 ENCOUNTER — Encounter: Payer: No Typology Code available for payment source | Admitting: Family Medicine

## 2020-10-11 ENCOUNTER — Encounter: Payer: No Typology Code available for payment source | Admitting: Nurse Practitioner

## 2020-10-16 ENCOUNTER — Telehealth (INDEPENDENT_AMBULATORY_CARE_PROVIDER_SITE_OTHER): Payer: Self-pay

## 2020-10-16 ENCOUNTER — Other Ambulatory Visit (HOSPITAL_COMMUNITY): Payer: No Typology Code available for payment source

## 2020-10-16 ENCOUNTER — Other Ambulatory Visit (INDEPENDENT_AMBULATORY_CARE_PROVIDER_SITE_OTHER): Payer: Self-pay

## 2020-10-16 ENCOUNTER — Encounter (INDEPENDENT_AMBULATORY_CARE_PROVIDER_SITE_OTHER): Payer: Self-pay

## 2020-10-16 ENCOUNTER — Encounter (HOSPITAL_COMMUNITY): Payer: No Typology Code available for payment source

## 2020-10-16 MED ORDER — SUTAB 1479-225-188 MG PO TABS: 1.0000 | ORAL_TABLET | Freq: Once | ORAL | 0 refills | Status: AC

## 2020-10-16 NOTE — Telephone Encounter (Signed)
Cheryl Ballard, CMA  

## 2020-10-16 NOTE — Telephone Encounter (Signed)
Referring MD/PCP: Jerelene Redden  Procedure: Tcs  Reason/Indication:  Screening  Has patient had this procedure before?  no  If so, when, by whom and where?    Is there a family history of colon cancer?  no  Who?  What age when diagnosed?    Is patient diabetic? If yes, Type 1 or Type 2   no      Does patient have prosthetic heart valve or mechanical valve?  no  Do you have a pacemaker/defibrillator?  no  Has patient ever had endocarditis/atrial fibrillation? no  Does patient use oxygen? no  Has patient had joint replacement within last 12 months?  no  Is patient constipated or do they take laxatives? no  Does patient have a history of alcohol/drug use?  no  Have you had a stroke/heart attack last 6 mths? no  Do you take medicine for weight loss?  no  For female patients,: do you still have your menstrual cycle? no  Is patient on blood thinner such as Coumadin, Plavix and/or Aspirin? no  Medications: Lexapro 20 mg daily, Metoprolol 50 mg daily  Allergies: nkda  Medication Adjustment per Dr Laural Golden none  Procedure date & time: 11/14/20 at 7:30

## 2020-10-17 ENCOUNTER — Encounter: Payer: Self-pay | Admitting: Nurse Practitioner

## 2020-10-17 ENCOUNTER — Ambulatory Visit (INDEPENDENT_AMBULATORY_CARE_PROVIDER_SITE_OTHER): Payer: No Typology Code available for payment source | Admitting: Nurse Practitioner

## 2020-10-17 ENCOUNTER — Other Ambulatory Visit: Payer: Self-pay

## 2020-10-17 VITALS — BP 138/82 | HR 70 | Temp 98.8°F | Ht 64.5 in | Wt 224.0 lb

## 2020-10-17 DIAGNOSIS — R7301 Impaired fasting glucose: Secondary | ICD-10-CM | POA: Insufficient documentation

## 2020-10-17 DIAGNOSIS — L989 Disorder of the skin and subcutaneous tissue, unspecified: Secondary | ICD-10-CM

## 2020-10-17 DIAGNOSIS — Z139 Encounter for screening, unspecified: Secondary | ICD-10-CM

## 2020-10-17 DIAGNOSIS — Z0001 Encounter for general adult medical examination with abnormal findings: Secondary | ICD-10-CM | POA: Diagnosis not present

## 2020-10-17 DIAGNOSIS — F418 Other specified anxiety disorders: Secondary | ICD-10-CM

## 2020-10-17 DIAGNOSIS — E669 Obesity, unspecified: Secondary | ICD-10-CM | POA: Insufficient documentation

## 2020-10-17 MED ORDER — RYBELSUS 7 MG PO TABS: 7.0000 mg | ORAL_TABLET | Freq: Every day | ORAL | 1 refills | Status: DC

## 2020-10-17 MED ORDER — RYBELSUS 3 MG PO TABS: 3.0000 mg | ORAL_TABLET | Freq: Every day | ORAL | 0 refills | Status: DC

## 2020-10-17 MED ORDER — ESCITALOPRAM OXALATE 20 MG PO TABS: ORAL_TABLET | ORAL | 3 refills | Status: DC

## 2020-10-17 NOTE — Progress Notes (Signed)
Established Patient Office Visit  Subjective:  Patient ID: Cheryl Ballard, female    DOB: [birth year]  Age: 45 y.o. MRN: [id number]  CC:  Chief Complaint  Patient presents with   Annual Exam    CPE    HPI Cheryl Ballard presents for physical exam. No acute concerns.   Past Medical History:  Diagnosis Date   Anxiety    cycles around   Back pain    Depression    Diverticulitis    Hypertension    Irregular menses 11/22/2014   S/P colostomy ([place])    had colostomy with surgery, but has been reversed   Sciatica 11/22/2014    Past Surgical History:  Procedure Laterality Date   BOWEL RESECTION     COLON SURGERY  04/2010   Colostomy in April     reversal in April 2013    Family History  Problem Relation Age of Onset   Heart disease Mother        afib   Parkinson's disease Mother    Arthritis Mother    Early death Brother    Heart disease Brother    Heart disease Maternal Grandmother        MI-CAD    Social History   Socioeconomic History   Marital status: Married    Spouse name: Gwyndolyn Saxon    Number of children: 0   Years of education: 16   Highest education level: Bachelor's degree (e.g., BA, AB, BS)  Occupational History   Occupation: Marine scientist in [employer]: [place]    Comment: 23 years   Tobacco Use   Smoking status: Some Days    Packs/day: 0.00    Years: 23.00    Pack years: 0.00    Types: Cigarettes   Smokeless tobacco: Never   Tobacco comments:    Patient states that she is trying to quit.  Vaping Use   Vaping Use: Never used  Substance and Sexual Activity   Alcohol use: Yes    Alcohol/week: 1.0 standard drink    Types: 1 Cans of beer per week    Comment: occasional   Drug use: No   Sexual activity: Yes    Birth control/protection: None  Other Topics Concern   Not on file  Social History Narrative   Lives with Gwyndolyn Saxon, husband of 4 years in May of 2020.   Great Dane named, Diesel who is 47 years old.      Main  caregiver for mom-who has parkinson's and DDD, she is 37 years old (2020).      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       Wears seat belt.   Wears sunscreen.   Eats fruits and veggies.   Consumes milk regularly.     Social Determinants of Health   Financial Resource Strain: Low Risk    Difficulty of Paying Living Expenses: Not hard at all  Food Insecurity: No Food Insecurity   Worried About Charity fundraiser in the Last Year: Never true   [place] in the Last Year: Never true  Transportation Needs: No Transportation Needs   Lack of Transportation (Medical): No   Lack of Transportation (Non-Medical): No  Physical Activity: Unknown   Days of Exercise per Week: 3 days   Minutes of Exercise per Session: Not on file  Stress: No Stress Concern Present   Feeling of Stress : Only a little  Social Connections: Moderately Integrated  Frequency of Communication with Friends and Family: More than three times a week   Frequency of Social Gatherings with Friends and Family: More than three times a week   Attends Religious Services: More than 4 times per year   Active Member of [employer] or Organizations: No   Attends Archivist Meetings: Never   Marital Status: Married  Human resources officer Violence: Not At Risk   Fear of Current or Ex-Partner: No   Emotionally Abused: No   Physically Abused: No   Sexually Abused: No    Outpatient Medications Prior to Visit  Medication Sig Dispense Refill   metoprolol succinate (TOPROL-XL) 50 MG 24 hr tablet TAKE 1 TABLET BY MOUTH IMMEDIATELY FOLLOWING A MEAL 30 tablet 0   escitalopram (LEXAPRO) 20 MG tablet TAKE ONE (1) TABLET BY MOUTH EVERY DAY 90 tablet 1   No facility-administered medications prior to visit.    No Known Allergies  ROS Review of Systems  Constitutional: Negative.   HENT: Negative.    Eyes: Negative.   Respiratory: Negative.    Cardiovascular: Negative.   Gastrointestinal: Negative.   Endocrine:  Negative.   Genitourinary: Negative.   Musculoskeletal: Negative.   Skin:  Positive for color change.       Hyperpigmented lesion to left hand  Allergic/Immunologic: Negative.   Neurological: Negative.   Hematological: Negative.   Psychiatric/Behavioral: Negative.       Objective:    Physical Exam Constitutional:      Appearance: Normal appearance. She is obese.  HENT:     Head: Normocephalic and atraumatic.     Right Ear: Tympanic membrane, ear canal and external ear normal.     Left Ear: Tympanic membrane, ear canal and external ear normal.     Nose: Nose normal.     Mouth/Throat:     Mouth: Mucous membranes are moist.     Pharynx: Oropharynx is clear.  Eyes:     Extraocular Movements: Extraocular movements intact.     Conjunctiva/sclera: Conjunctivae normal.     Pupils: Pupils are equal, round, and reactive to light.  Cardiovascular:     Rate and Rhythm: Normal rate and regular rhythm.     Pulses: Normal pulses.     Heart sounds: Normal heart sounds.  Pulmonary:     Effort: Pulmonary effort is normal.     Breath sounds: Normal breath sounds.  Abdominal:     General: Abdomen is flat. Bowel sounds are normal.     Palpations: Abdomen is soft.  Musculoskeletal:        General: Normal range of motion.     Cervical back: Normal range of motion and neck supple.  Skin:    General: Skin is warm and dry.     Capillary Refill: Capillary refill takes less than 2 seconds.  Neurological:     General: No focal deficit present.     Mental Status: She is alert and oriented to person, place, and time.     Cranial Nerves: No cranial nerve deficit.     Sensory: No sensory deficit.     Motor: No weakness.     Coordination: Coordination normal.     Gait: Gait normal.  Psychiatric:        Mood and Affect: Mood normal.        Behavior: Behavior normal.        Thought Content: Thought content normal.        Judgment: Judgment normal.    BP 138/82 (BP Location:  Left Arm, Patient  Position: Sitting, Cuff Size: Large)   Pulse 70   Temp 98.8 F (37.1 C) (Oral)   Ht 5' 4.5" (1.638 m)   Wt 224 lb (101.6 kg)   LMP 10/10/2020 (Approximate)   SpO2 98%   BMI 37.86 kg/m  Wt Readings from Last 3 Encounters:  10/17/20 224 lb (101.6 kg)  06/27/20 220 lb 8 oz (100 kg)  06/21/19 187 lb (84.8 kg)     Health Maintenance Due  Topic Date Due   HIV Screening  Never done   Hepatitis C Screening  Never done   COLONOSCOPY (Pts 45-69yr Insurance coverage will need to be confirmed)  Never done   INFLUENZA VACCINE  10/01/2020    There are no preventive care reminders to display for this patient.  Lab Results  Component Value Date   TSH 2.570 06/27/2020   Lab Results  Component Value Date   WBC 9.4 06/27/2020   HGB 15.3 06/27/2020   HCT 44.4 06/27/2020   MCV 91 06/27/2020   PLT 271 06/27/2020   Lab Results  Component Value Date   NA 140 06/27/2020   K 4.7 06/27/2020   CO2 21 06/27/2020   GLUCOSE 101 (H) 06/27/2020   BUN 16 06/27/2020   CREATININE 0.93 06/27/2020   BILITOT 0.3 06/27/2020   ALKPHOS 81 06/27/2020   AST 18 06/27/2020   ALT 21 06/27/2020   PROT 6.8 06/27/2020   ALBUMIN 4.5 06/27/2020   CALCIUM 9.6 06/27/2020   EGFR 76 06/27/2020   Lab Results  Component Value Date   CHOL 179 06/27/2020   Lab Results  Component Value Date   HDL 49 06/27/2020   Lab Results  Component Value Date   LDLCALC 110 (H) 06/27/2020   Lab Results  Component Value Date   TRIG 111 06/27/2020   Lab Results  Component Value Date   CHOLHDL 3.7 06/27/2020   Lab Results  Component Value Date   HGBA1C 5.5 06/27/2020      Assessment & Plan:   Problem List Items Addressed This Visit       Endocrine   IFG (impaired fasting glucose)    -last fasting glucose was 101 -A1c was 5.5 -repeat A1c in 2 months      Relevant Medications   Semaglutide (RYBELSUS) 3 MG TABS   Semaglutide (RYBELSUS) 7 MG TABS (Start on 11/16/2020)     Musculoskeletal and  Integument   Skin lesion of hand    -hyperpigmented lesion to left hand -referral to derm      Relevant Orders   Ambulatory referral to Dermatology     Other   Situational anxiety   Relevant Medications   escitalopram (LEXAPRO) 20 MG tablet   Encounter for general adult medical examination with abnormal findings - Primary    -left hand lesion      Relevant Orders   CBC with Differential/Platelet   CMP14+EGFR   Lipid Panel With LDL/HDL Ratio   Hemoglobin A1c   Obesity (BMI 35.0-39.9 without comorbidity)    BMI Readings from Last 3 Encounters:  10/17/20 37.86 kg/m  06/27/20 36.69 kg/m  06/21/19 31.12 kg/m        Relevant Medications   Semaglutide (RYBELSUS) 3 MG TABS   Semaglutide (RYBELSUS) 7 MG TABS (Start on 11/16/2020)   Other Visit Diagnoses     Screening due       Relevant Orders   Hepatitis C Antibody   HIV antibody (with reflex)  Meds ordered this encounter  Medications   escitalopram (LEXAPRO) 20 MG tablet    Sig: TAKE ONE (1) TABLET BY MOUTH EVERY DAY    Dispense:  90 tablet    Refill:  3   Semaglutide (RYBELSUS) 3 MG TABS    Sig: Take 3 mg by mouth daily.    Dispense:  30 tablet    Refill:  0    <SAMPLE>   Semaglutide (RYBELSUS) 7 MG TABS    Sig: Take 7 mg by mouth daily.    Dispense:  30 tablet    Refill:  1    Follow-up: Return in about 6 weeks (around 11/28/2020) for Med check (rybelsus).    Noreene Larsson, NP

## 2020-10-17 NOTE — Assessment & Plan Note (Signed)
-  hyperpigmented lesion to left hand -referral to derm

## 2020-10-17 NOTE — Assessment & Plan Note (Signed)
-  last fasting glucose was 101 -A1c was 5.5 -repeat A1c in 2 months

## 2020-10-17 NOTE — Assessment & Plan Note (Signed)
BMI Readings from Last 3 Encounters:  10/17/20 37.86 kg/m  06/27/20 36.69 kg/m  06/21/19 31.12 kg/m

## 2020-10-17 NOTE — Patient Instructions (Signed)
Please have fasting labs drawn 2-3 days prior to your appointment so we can discuss the results during your office visit.  

## 2020-10-17 NOTE — Assessment & Plan Note (Signed)
-  left hand lesion

## 2020-10-22 ENCOUNTER — Other Ambulatory Visit: Payer: Self-pay | Admitting: Cardiovascular Disease

## 2020-10-30 ENCOUNTER — Ambulatory Visit (HOSPITAL_COMMUNITY)
Admission: RE | Admit: 2020-10-30 | Discharge: 2020-10-30 | Disposition: A | Discharge status: Home / Self Care | Payer: No Typology Code available for payment source | Source: Ambulatory Visit | Attending: Women's Health | Admitting: Women's Health

## 2020-10-30 ENCOUNTER — Other Ambulatory Visit: Payer: Self-pay

## 2020-10-30 ENCOUNTER — Encounter (HOSPITAL_COMMUNITY): Payer: Self-pay

## 2020-10-30 DIAGNOSIS — N6310 Unspecified lump in the right breast, unspecified quadrant: Secondary | ICD-10-CM

## 2020-11-01 ENCOUNTER — Encounter: Payer: Self-pay | Admitting: Nurse Practitioner

## 2020-11-02 ENCOUNTER — Other Ambulatory Visit: Payer: Self-pay

## 2020-11-02 DIAGNOSIS — M898X7 Other specified disorders of bone, ankle and foot: Secondary | ICD-10-CM

## 2020-11-07 NOTE — Patient Instructions (Signed)
Cheryl Ballard  11/07/2020     '@PREFPERIOPPHARMACY'$ @   Your procedure is scheduled on  11/14/2020.   Report to [facility] at  [phone] A.M.   Call this number if you have problems the morning of surgery:  [phone]   Remember:  Follow the diet and prep instructions given to you by the office.    Take these medicines the morning of surgery with A SIP OF WATER                               lexapro, metoprolol.     Do not wear jewelry, make-up or nail polish.  Do not wear lotions, powders, or perfumes, or deodorant.  Do not shave 48 hours prior to surgery.  Men may shave face and neck.  Do not bring valuables to the hospital.  [facility] is not responsible for any belongings or valuables.  Contacts, dentures or bridgework may not be worn into surgery.  Leave your suitcase in the car.  After surgery it may be brought to your room.  For patients admitted to the hospital, discharge time will be determined by your treatment team.  Patients discharged the day of surgery will not be allowed to drive home and must have someone with them for 24 hours.    Special instructions:   DO NOT smoke tobacco or vape for 24 hours before your procedure.  Please read over the following fact sheets that you were given. Anesthesia Post-op Instructions and Care and Recovery After Surgery      Colonoscopy, Adult, Care After This sheet gives you information about how to care for yourself after your procedure. Your health care provider may also give you more specific instructions. If you have problems or questions, contact your health care provider. What can I expect after the procedure? After the procedure, it is common to have: A small amount of blood in your stool for 24 hours after the procedure. Some gas. Mild cramping or bloating of your abdomen. Follow these instructions at home: Eating and drinking  Drink enough fluid to keep your urine pale yellow. Follow instructions from your  health care provider about eating or drinking restrictions. Resume your normal diet as instructed by your health care provider. Avoid heavy or fried foods that are hard to digest. Activity Rest as told by your health care provider. Avoid sitting for a long time without moving. Get up to take short walks every 1-2 hours. This is important to improve blood flow and breathing. Ask for help if you feel weak or unsteady. Return to your normal activities as told by your health care provider. Ask your health care provider what activities are safe for you. Managing cramping and bloating  Try walking around when you have cramps or feel bloated. Apply heat to your abdomen as told by your health care provider. Use the heat source that your health care provider recommends, such as a moist heat pack or a heating pad. Place a towel between your skin and the heat source. Leave the heat on for 20-30 minutes. Remove the heat if your skin turns bright red. This is especially important if you are unable to feel pain, heat, or cold. You may have a greater risk of getting burned. General instructions If you were given a sedative during the procedure, it can affect you for several hours. Do not drive or operate machinery until your  health care provider says that it is safe. For the first 24 hours after the procedure: Do not sign important documents. Do not drink alcohol. Do your regular daily activities at a slower pace than normal. Eat soft foods that are easy to digest. Take over-the-counter and prescription medicines only as told by your health care provider. Keep all follow-up visits as told by your health care provider. This is important. Contact a health care provider if: You have blood in your stool 2-3 days after the procedure. Get help right away if you have: More than a small spotting of blood in your stool. Large blood clots in your stool. Swelling of your abdomen. Nausea or vomiting. A  fever. Increasing pain in your abdomen that is not relieved with medicine. Summary After the procedure, it is common to have a small amount of blood in your stool. You may also have mild cramping and bloating of your abdomen. If you were given a sedative during the procedure, it can affect you for several hours. Do not drive or operate machinery until your health care provider says that it is safe. Get help right away if you have a lot of blood in your stool, nausea or vomiting, a fever, or increased pain in your abdomen. This information is not intended to replace advice given to you by your health care provider. Make sure you discuss any questions you have with your health care provider. Document Revised: 02/11/2019 Document Reviewed: 09/13/2018 Elsevier Patient Education  [place] After This sheet gives you information about how to care for yourself after your procedure. Your health care provider may also give you more specific instructions. If you have problems or questions, contact your health care provider. What can I expect after the procedure? After the procedure, it is common to have: Tiredness. Forgetfulness about what happened after the procedure. Impaired judgment for important decisions. Nausea or vomiting. Some difficulty with balance. Follow these instructions at home: For the time period you were told by your health care provider:   Rest as needed. Do not participate in activities where you could fall or become injured. Do not drive or use machinery. Do not drink alcohol. Do not take sleeping pills or medicines that cause drowsiness. Do not make important decisions or sign legal documents. Do not take care of children on your own. Eating and drinking Follow the diet that is recommended by your health care provider. Drink enough fluid to keep your urine pale yellow. If you vomit: Drink water, juice, or soup when you can drink  without vomiting. Make sure you have little or no nausea before eating solid foods. General instructions Have a responsible adult stay with you for the time you are told. It is important to have someone help care for you until you are awake and alert. Take over-the-counter and prescription medicines only as told by your health care provider. If you have sleep apnea, surgery and certain medicines can increase your risk for breathing problems. Follow instructions from your health care provider about wearing your sleep device: Anytime you are sleeping, including during daytime naps. While taking prescription pain medicines, sleeping medicines, or medicines that make you drowsy. Avoid smoking. Keep all follow-up visits as told by your health care provider. This is important. Contact a health care provider if: You keep feeling nauseous or you keep vomiting. You feel light-headed. You are still sleepy or having trouble with balance after 24 hours. You develop a rash. You  have a fever. You have redness or swelling around the IV site. Get help right away if: You have trouble breathing. You have new-onset confusion at home. Summary For several hours after your procedure, you may feel tired. You may also be forgetful and have poor judgment. Have a responsible adult stay with you for the time you are told. It is important to have someone help care for you until you are awake and alert. Rest as told. Do not drive or operate machinery. Do not drink alcohol or take sleeping pills. Get help right away if you have trouble breathing, or if you suddenly become confused. This information is not intended to replace advice given to you by your health care provider. Make sure you discuss any questions you have with your health care provider. Document Revised: 11/03/2019 Document Reviewed: 01/20/2019 Elsevier Patient Education  2022 [employer].

## 2020-11-09 ENCOUNTER — Encounter (HOSPITAL_COMMUNITY): Payer: Self-pay

## 2020-11-09 ENCOUNTER — Encounter (HOSPITAL_COMMUNITY)
Admission: RE | Admit: 2020-11-09 | Discharge: 2020-11-09 | Disposition: A | Discharge status: Home / Self Care | Payer: No Typology Code available for payment source | Source: Ambulatory Visit | Attending: Internal Medicine | Admitting: Internal Medicine

## 2020-11-09 ENCOUNTER — Other Ambulatory Visit: Payer: Self-pay

## 2020-11-09 DIAGNOSIS — Z01812 Encounter for preprocedural laboratory examination: Secondary | ICD-10-CM | POA: Diagnosis not present

## 2020-11-09 LAB — CBC WITH DIFFERENTIAL/PLATELET
Abs Immature Granulocytes: 0.06 10*3/uL (ref 0.00–0.07)
Basophils Absolute: 0.1 10*3/uL (ref 0.0–0.1)
Basophils Relative: 1 %
Eosinophils Absolute: 0.3 10*3/uL (ref 0.0–0.5)
Eosinophils Relative: 3 %
HCT: 44.1 % (ref 36.0–46.0)
Hemoglobin: 14.6 g/dL (ref 12.0–15.0)
Immature Granulocytes: 1 %
Lymphocytes Relative: 23 %
Lymphs Abs: 2.2 10*3/uL (ref 0.7–4.0)
MCH: 31.5 pg (ref 26.0–34.0)
MCHC: 33.1 g/dL (ref 30.0–36.0)
MCV: 95 fL (ref 80.0–100.0)
Monocytes Absolute: 0.8 10*3/uL (ref 0.1–1.0)
Monocytes Relative: 8 %
Neutro Abs: 6.2 10*3/uL (ref 1.7–7.7)
Neutrophils Relative %: 64 %
Platelets: 257 10*3/uL (ref 150–400)
RBC: 4.64 MIL/uL (ref 3.87–5.11)
RDW: 12.9 % (ref 11.5–15.5)
WBC: 9.5 10*3/uL (ref 4.0–10.5)
nRBC: 0 % (ref 0.0–0.2)

## 2020-11-09 LAB — HCG, SERUM, QUALITATIVE: Preg, Serum: NEGATIVE

## 2020-11-14 ENCOUNTER — Ambulatory Visit (HOSPITAL_COMMUNITY)
Admission: RE | Admit: 2020-11-14 | Discharge: 2020-11-14 | Disposition: A | Discharge status: Home / Self Care | Payer: No Typology Code available for payment source | Source: Ambulatory Visit | Attending: Internal Medicine | Admitting: Internal Medicine

## 2020-11-14 ENCOUNTER — Ambulatory Visit (HOSPITAL_COMMUNITY): Payer: No Typology Code available for payment source | Admitting: Anesthesiology

## 2020-11-14 ENCOUNTER — Encounter (HOSPITAL_COMMUNITY)
Admission: RE | Disposition: A | Discharge status: Home / Self Care | Payer: Self-pay | Source: Ambulatory Visit | Attending: Internal Medicine

## 2020-11-14 ENCOUNTER — Other Ambulatory Visit: Payer: Self-pay

## 2020-11-14 ENCOUNTER — Encounter (HOSPITAL_COMMUNITY): Payer: Self-pay | Admitting: Internal Medicine

## 2020-11-14 DIAGNOSIS — Z1211 Encounter for screening for malignant neoplasm of colon: Secondary | ICD-10-CM | POA: Insufficient documentation

## 2020-11-14 DIAGNOSIS — D123 Benign neoplasm of transverse colon: Secondary | ICD-10-CM | POA: Insufficient documentation

## 2020-11-14 DIAGNOSIS — K644 Residual hemorrhoidal skin tags: Secondary | ICD-10-CM | POA: Diagnosis not present

## 2020-11-14 DIAGNOSIS — K5730 Diverticulosis of large intestine without perforation or abscess without bleeding: Secondary | ICD-10-CM | POA: Diagnosis not present

## 2020-11-14 DIAGNOSIS — F17210 Nicotine dependence, cigarettes, uncomplicated: Secondary | ICD-10-CM | POA: Insufficient documentation

## 2020-11-14 DIAGNOSIS — Z980 Intestinal bypass and anastomosis status: Secondary | ICD-10-CM

## 2020-11-14 DIAGNOSIS — Z79899 Other long term (current) drug therapy: Secondary | ICD-10-CM | POA: Diagnosis not present

## 2020-11-14 DIAGNOSIS — Z9049 Acquired absence of other specified parts of digestive tract: Secondary | ICD-10-CM | POA: Insufficient documentation

## 2020-11-14 HISTORY — PX: COLONOSCOPY WITH PROPOFOL: SHX5780

## 2020-11-14 HISTORY — PX: POLYPECTOMY: SHX5525

## 2020-11-14 LAB — HM COLONOSCOPY

## 2020-11-14 SURGERY — COLONOSCOPY WITH PROPOFOL
Anesthesia: General

## 2020-11-14 MED ORDER — PROPOFOL 10 MG/ML IV BOLUS
INTRAVENOUS | Status: DC | PRN
  Administered 2020-11-14: 100 mg via INTRAVENOUS

## 2020-11-14 MED ORDER — PROPOFOL 500 MG/50ML IV EMUL
INTRAVENOUS | Status: DC | PRN
  Administered 2020-11-14: 150 ug/kg/min via INTRAVENOUS

## 2020-11-14 MED ORDER — LIDOCAINE HCL (CARDIAC) PF 100 MG/5ML IV SOSY
PREFILLED_SYRINGE | INTRAVENOUS | Status: DC | PRN
  Administered 2020-11-14: 50 mg via INTRAVENOUS

## 2020-11-14 MED ORDER — DEXMEDETOMIDINE (PRECEDEX) IN NS 20 MCG/5ML (4 MCG/ML) IV SYRINGE
PREFILLED_SYRINGE | INTRAVENOUS | Status: DC | PRN
  Administered 2020-11-14 (×2): 10 ug via INTRAVENOUS

## 2020-11-14 MED ORDER — METAMUCIL SMOOTH TEXTURE 58.6 % PO POWD: 1.0000 | Freq: Every day | ORAL | Status: DC

## 2020-11-14 MED ORDER — STERILE WATER FOR IRRIGATION IR SOLN
Status: DC | PRN
  Administered 2020-11-14: 200 mL

## 2020-11-14 MED ORDER — LACTATED RINGERS IV SOLN: INTRAVENOUS | Status: DC

## 2020-11-14 NOTE — Discharge Instructions (Addendum)
No aspirin or NSAIDs for 24 hours. Resume usual medications as before. Metamucil 3 to 4 g by mouth daily at bedtime. High-fiber diet. No driving for 24 hours. Physician will call with biopsy results.

## 2020-11-14 NOTE — Anesthesia Procedure Notes (Signed)
Date/Time: 11/14/2020 7:35 AM Performed by: Orlie Dakin, CRNA Pre-anesthesia Checklist: Patient identified, Emergency Drugs available, Suction available and Patient being monitored Patient Re-evaluated:Patient Re-evaluated prior to induction Oxygen Delivery Method: Nasal cannula Induction Type: IV induction Placement Confirmation: positive ETCO2

## 2020-11-14 NOTE — Op Note (Signed)
[facility] Patient Name: Cheryl Ballard Procedure Date: 11/14/2020 7:05 AM MRN: [id number] Date of Birth: 1975-12-14 Attending MD: Hildred Laser , MD CSN: [id number] Age: 45 Admit Type: Outpatient Procedure:                Colonoscopy Indications:              Screening for colorectal malignant neoplasm Providers:                Hildred Laser, MD, Lurline Del, RN, Casimer Bilis, Technician Referring MD:             Noreene Larsson, NP Medicines:                Propofol per Anesthesia Complications:            No immediate complications. Estimated Blood Loss:     Estimated blood loss was minimal. Procedure:                Pre-Anesthesia Assessment:                           - Prior to the procedure, a History and Physical                            was performed, and patient medications and                            allergies were reviewed. The patient's tolerance of                            previous anesthesia was also reviewed. The risks                            and benefits of the procedure and the sedation                            options and risks were discussed with the patient.                            All questions were answered, and informed consent                            was obtained. Prior Anticoagulants: The patient has                            taken no previous anticoagulant or antiplatelet                            agents. ASA Grade Assessment: II - A patient with                            mild systemic disease. After reviewing the risks  and benefits, the patient was deemed in                            satisfactory condition to undergo the procedure.                           After obtaining informed consent, the colonoscope                            was passed under direct vision. Throughout the                            procedure, the patient's blood pressure, pulse, and                             oxygen saturations were monitored continuously. The                            [id number] scope was introduced through                            the anus and advanced to the the cecum, identified                            by appendiceal orifice and ileocecal valve. The                            colonoscopy was performed without difficulty. The                            patient tolerated the procedure well. The quality                            of the bowel preparation was good except the cecum                            was fair. The ileocecal valve, appendiceal orifice,                            and rectum were photographed. Scope In: 7:33:28 AM Scope Out: 7:46:53 AM Scope Withdrawal Time: 0 hours 9 minutes 37 seconds  Total Procedure Duration: 0 hours 13 minutes 25 seconds  Findings:      The perianal and digital rectal examinations were normal.      A 4 mm polyp was found in the hepatic flexure. The polyp was sessile.       The polyp was removed with a cold snare. Resection and retrieval were       complete.      A few diverticula were found in the proximal sigmoid colon.      There was evidence of a prior end-to-end colo-colonic anastomosis in the       recto-sigmoid colon. This was patent.      External hemorrhoids were found during retroflexion. The hemorrhoids       were small. Impression:               -  One 4 mm polyp at the hepatic flexure, removed                            with a cold snare. Resected and retrieved.                           - Diverticulosis in the proximal sigmoid colon.                           - Patent end-to-end colo-colonic anastomosis.                           - External hemorrhoids. Moderate Sedation:      Per Anesthesia Care Recommendation:           - Patient has a contact number available for                            emergencies. The signs and symptoms of potential                            delayed complications  were discussed with the                            patient. Return to normal activities tomorrow.                            Written discharge instructions were provided to the                            patient.                           - High fiber diet today.                           - Continue present medications.                           - No aspirin, ibuprofen, naproxen, or other                            non-steroidal anti-inflammatory drugs for 1 day.                           - Await pathology results.                           - Repeat colonoscopy is recommended. The                            colonoscopy date will be determined after pathology                            results from today's exam become available for  review. Procedure Code(s):        --- Professional ---                           [id number], Colonoscopy, flexible; with removal of                            tumor(s), polyp(s), or other lesion(s) by snare                            technique Diagnosis Code(s):        --- Professional ---                           Z12.11, Encounter for screening for malignant                            neoplasm of colon                           K63.5, Polyp of colon                           K64.4, Residual hemorrhoidal skin tags                           Z98.0, Intestinal bypass and anastomosis status                           K57.30, Diverticulosis of large intestine without                            perforation or abscess without bleeding CPT copyright 2019 American Medical Association. All rights reserved. The codes documented in this report are preliminary and upon coder review may  be revised to meet current compliance requirements. Hildred Laser, MD Hildred Laser, MD 11/14/2020 7:57:42 AM This report has been signed electronically. Number of Addenda: 0

## 2020-11-14 NOTE — Anesthesia Postprocedure Evaluation (Signed)
Anesthesia Post Note  Patient: Cheryl Ballard  Procedure(s) Performed: COLONOSCOPY WITH PROPOFOL POLYPECTOMY  Patient location during evaluation: Phase II Anesthesia Type: General Level of consciousness: awake and alert and oriented Pain management: pain level controlled Vital Signs Assessment: post-procedure vital signs reviewed and stable Respiratory status: spontaneous breathing and respiratory function stable Cardiovascular status: blood pressure returned to baseline and stable Postop Assessment: no apparent nausea or vomiting Anesthetic complications: no   No notable events documented.   Last Vitals:  Vitals:   11/14/20 0652 11/14/20 0752  BP: (!) 118/94 (!) 97/55  Resp: (!) 21 19  Temp: 36.8 C 36.8 C  SpO2: 96% 99%    Last Pain:  Vitals:   11/14/20 0752  TempSrc: Oral  PainSc: 0-No pain                 Cheryl Ballard C Merrell [place]

## 2020-11-14 NOTE — H&P (Signed)
Cheryl Ballard is an 45 y.o. female.   Chief Complaint: Patient is here for colonoscopy. HPI: Patient is 45 year old Caucasian female who is here for screening colonoscopy.  She denies abdominal pain change in bowel habits or rectal bleeding.  She does not take aspirin NSAIDs or anticoagulants. Past history significant for sigmoid colon resection and colostomy for complicated diverticulitis.  Colostomy was subsequently reversed.  She has not had any problems since then. Patient says she has not started semaglutide yet.  She has been prescribed this medication to help her lose weight. Family history is negative for CRC.  Past Medical History:  Diagnosis Date   Anxiety    cycles around   Back pain    Depression    Diverticulitis    Hypertension    Irregular menses 11/22/2014   S/P colostomy ([place])    had colostomy with surgery, but has been reversed   Sciatica 11/22/2014    Past Surgical History:  Procedure Laterality Date   BOWEL RESECTION     COLON SURGERY  04/2010   Colostomy in April     reversal in April 2013    Family History  Problem Relation Age of Onset   Heart disease Mother        afib   Parkinson's disease Mother    Arthritis Mother    Early death Brother    Heart disease Brother    Heart disease Maternal Grandmother        MI-CAD   Social History:  reports that she has been smoking cigarettes. She has never used smokeless tobacco. She reports current alcohol use of about 1.0 standard drink per week. She reports that she does not use drugs.  Allergies: No Known Allergies  Medications Prior to Admission  Medication Sig Dispense Refill   escitalopram (LEXAPRO) 20 MG tablet TAKE ONE (1) TABLET BY MOUTH EVERY DAY 90 tablet 3   metoprolol succinate (TOPROL-XL) 50 MG 24 hr tablet TAKE 1 TABLET BY MOUTH IMMEDIATELY FOLLOWING A MEAL 30 tablet 0   Semaglutide (RYBELSUS) 3 MG TABS Take 3 mg by mouth daily. 30 tablet 0   SUTAB [phone] MG TABS Take 12 tablets by mouth  as directed.     [START ON 11/16/2020] Semaglutide (RYBELSUS) 7 MG TABS Take 7 mg by mouth daily. (Patient not taking: Reported on 11/02/2020) 30 tablet 1    No results found for this or any previous visit (from the past 48 hour(s)). No results found.  Review of Systems  Blood pressure (!) 118/94, temperature 98.3 F (36.8 C), temperature source Oral, resp. rate (!) 21, height 5' 4.5" (1.638 m), weight 99.8 kg, last menstrual period 10/16/2020, SpO2 96 %. Physical Exam HENT:     Mouth/Throat:     Mouth: Mucous membranes are moist.     Pharynx: Oropharynx is clear.  Eyes:     General: No scleral icterus.    Conjunctiva/sclera: Conjunctivae normal.  Cardiovascular:     Rate and Rhythm: Normal rate and regular rhythm.     Heart sounds: Normal heart sounds. No murmur heard. Pulmonary:     Effort: Pulmonary effort is normal.     Breath sounds: Normal breath sounds.  Abdominal:     Comments: Abdomen is symmetrical.  She has lower midline and left-sided scars.  Abdomen is soft and nontender without organomegaly or masses.  Musculoskeletal:        General: No swelling.     Cervical back: Neck supple.  Lymphadenopathy:  Cervical: No cervical adenopathy.  Skin:    General: Skin is warm and dry.  Neurological:     Mental Status: She is alert.     Assessment/Plan  Average risk screening colonoscopy.  Hildred Laser, MD 11/14/2020, 7:19 AM

## 2020-11-14 NOTE — Anesthesia Preprocedure Evaluation (Signed)
Anesthesia Evaluation  Patient identified by MRN, date of birth, ID band Patient awake    Reviewed: Allergy & Precautions, NPO status , Patient's Chart, lab work & pertinent test results, reviewed documented beta blocker date and time   Airway Mallampati: II  TM Distance: >3 FB Neck ROM: Full    Dental  (+) Dental Advisory Given, Teeth Intact   Pulmonary Current Smoker and Patient abstained from smoking.,    breath sounds clear to auscultation       Cardiovascular Exercise Tolerance: Good hypertension, Pt. on medications and Pt. on home beta blockers Normal cardiovascular exam Rhythm:Regular Rate:Normal     Neuro/Psych PSYCHIATRIC DISORDERS Anxiety Depression  Neuromuscular disease (sciatica )    GI/Hepatic negative GI ROS, Neg liver ROS, Colostomy reversal    Endo/Other  negative endocrine ROS  Renal/GU negative Renal ROS     Musculoskeletal   Abdominal   Peds  Hematology negative hematology ROS (+)   Anesthesia Other Findings Snoring   Reproductive/Obstetrics negative OB ROS                            Anesthesia Physical Anesthesia Plan  ASA: 2  Anesthesia Plan: General   Post-op Pain Management:    Induction: Intravenous  PONV Risk Score and Plan: Propofol infusion  Airway Management Planned: Nasal Cannula and Natural Airway  Additional Equipment:   Intra-op Plan:   Post-operative Plan:   Informed Consent: I have reviewed the patients History and Physical, chart, labs and discussed the procedure including the risks, benefits and alternatives for the proposed anesthesia with the patient or authorized representative who has indicated his/her understanding and acceptance.     Dental advisory given  Plan Discussed with: Surgeon  Anesthesia Plan Comments:         Anesthesia Quick Evaluation

## 2020-11-14 NOTE — Transfer of Care (Signed)
Immediate Anesthesia Transfer of Care Note  Patient: Cheryl Ballard  Procedure(s) Performed: COLONOSCOPY WITH PROPOFOL POLYPECTOMY  Patient Location: Short Stay  Anesthesia Type:General  Level of Consciousness: awake, alert  and oriented  Airway & Oxygen Therapy: Patient Spontanous Breathing  Post-op Assessment: Report given to RN, Post -op Vital signs reviewed and stable and Patient moving all extremities X 4  Post vital signs: Reviewed and stable  Last Vitals:  Vitals Value Taken Time  BP    Temp    Pulse    Resp    SpO2      Last Pain:  Vitals:   11/14/20 0652  TempSrc: Oral  PainSc: 0-No pain      Patients Stated Pain Goal: 6 ([id number])  Complications: No notable events documented.

## 2020-11-15 ENCOUNTER — Ambulatory Visit (INDEPENDENT_AMBULATORY_CARE_PROVIDER_SITE_OTHER): Payer: No Typology Code available for payment source | Admitting: Nurse Practitioner

## 2020-11-15 ENCOUNTER — Encounter: Payer: Self-pay | Admitting: Nurse Practitioner

## 2020-11-15 VITALS — Temp 99.9°F | Ht 64.5 in | Wt 220.0 lb

## 2020-11-15 DIAGNOSIS — U071 COVID-19: Secondary | ICD-10-CM | POA: Diagnosis not present

## 2020-11-15 LAB — SURGICAL PATHOLOGY

## 2020-11-15 MED ORDER — IBUPROFEN 600 MG PO TABS: 600.0000 mg | ORAL_TABLET | Freq: Three times a day (TID) | ORAL | 0 refills | Status: DC | PRN

## 2020-11-15 MED ORDER — BENZONATATE 100 MG PO CAPS: 100.0000 mg | ORAL_CAPSULE | Freq: Two times a day (BID) | ORAL | 0 refills | Status: DC | PRN

## 2020-11-15 MED ORDER — NIRMATRELVIR/RITONAVIR (PAXLOVID)TABLET: 3.0000 | ORAL_TABLET | Freq: Two times a day (BID) | ORAL | 0 refills | Status: AC

## 2020-11-15 NOTE — Assessment & Plan Note (Signed)
-  positive home test yesterday -Rx. Ibuprofen, tessalon, and paxlovid

## 2020-11-15 NOTE — Progress Notes (Addendum)
Acute Office Visit  Subjective:    Patient ID: Cheryl Ballard, female    DOB: [birth year], 45 y.o.   MRN: [id number]  Chief Complaint  Patient presents with   Covid Positive    Tested positive yesterday on an at home test. Headache, fever up to 101, body aches, chills. No taste.     HPI Patient is in today for sick visit. Symptoms started yesterday. She has not taken any OTC meds.  Past Medical History:  Diagnosis Date   Anxiety    cycles around   Back pain    Depression    Diverticulitis    Hypertension    Irregular menses 11/22/2014   S/P colostomy ([place])    had colostomy with surgery, but has been reversed   Sciatica 11/22/2014    Past Surgical History:  Procedure Laterality Date   BOWEL RESECTION     COLON SURGERY  04/2010   Colostomy in April     reversal in April 2013    Family History  Problem Relation Age of Onset   Heart disease Mother        afib   Parkinson's disease Mother    Arthritis Mother    Early death Brother    Heart disease Brother    Heart disease Maternal Grandmother        MI-CAD    Social History   Socioeconomic History   Marital status: Married    Spouse name: Gwyndolyn Saxon    Number of children: 0   Years of education: 16   Highest education level: Bachelor's degree (e.g., BA, AB, BS)  Occupational History   Occupation: Marine scientist in [employer]: [employer]    Comment: 23 years   Tobacco Use   Smoking status: Some Days    Packs/day: 0.00    Years: 23.00    Pack years: 0.00    Types: Cigarettes   Smokeless tobacco: Never   Tobacco comments:    Patient states that she is trying to quit.  Vaping Use   Vaping Use: Never used  Substance and Sexual Activity   Alcohol use: Yes    Alcohol/week: 1.0 standard drink    Types: 1 Cans of beer per week    Comment: occasional   Drug use: No   Sexual activity: Yes    Birth control/protection: None  Other Topics Concern   Not on file  Social History Narrative   Lives  with Gwyndolyn Saxon, husband of 4 years in May of 2020.   Great Dane named, Diesel who is 47 years old.      Main caregiver for mom-who has parkinson's and DDD, she is 21 years old (2020).      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       Wears seat belt.   Wears sunscreen.   Eats fruits and veggies.   Consumes milk regularly.     Social Determinants of Health   Financial Resource Strain: Low Risk    Difficulty of Paying Living Expenses: Not hard at all  Food Insecurity: No Food Insecurity   Worried About Charity fundraiser in the Last Year: Never true   [place] in the Last Year: Never true  Transportation Needs: No Transportation Needs   Lack of Transportation (Medical): No   Lack of Transportation (Non-Medical): No  Physical Activity: Unknown   Days of Exercise per Week: 3 days   Minutes of Exercise per  Session: Not on file  Stress: No Stress Concern Present   Feeling of Stress : Only a little  Social Connections: Moderately Integrated   Frequency of Communication with Friends and Family: More than three times a week   Frequency of Social Gatherings with Friends and Family: More than three times a week   Attends Religious Services: More than 4 times per year   Active Member of [employer] or Organizations: No   Attends Archivist Meetings: Never   Marital Status: Married  Human resources officer Violence: Not At Risk   Fear of Current or Ex-Partner: No   Emotionally Abused: No   Physically Abused: No   Sexually Abused: No    Outpatient Medications Prior to Visit  Medication Sig Dispense Refill   escitalopram (LEXAPRO) 20 MG tablet TAKE ONE (1) TABLET BY MOUTH EVERY DAY 90 tablet 3   metoprolol succinate (TOPROL-XL) 50 MG 24 hr tablet TAKE 1 TABLET BY MOUTH IMMEDIATELY FOLLOWING A MEAL 30 tablet 0   psyllium (METAMUCIL SMOOTH TEXTURE) 58.6 % powder Take 1 packet by mouth at bedtime.  \   Semaglutide (RYBELSUS) 3 MG TABS Take 3 mg by mouth daily. 30 tablet 0    [START ON 11/16/2020] Semaglutide (RYBELSUS) 7 MG TABS Take 7 mg by mouth daily. (Patient not taking: Reported on 11/02/2020) 30 tablet 1   SUTAB [id number] MG TABS Take 12 tablets by mouth as directed.     No facility-administered medications prior to visit.    No Known Allergies  Review of Systems  Constitutional:  Positive for chills, fatigue and fever.  HENT:  Positive for congestion and sore throat. Negative for sinus pressure and sinus pain.   Respiratory:  Positive for cough. Negative for shortness of breath and wheezing.   Neurological:  Positive for headaches.      Objective:    Physical Exam  Temp 99.9 F (37.7 C)   Ht 5' 4.5" (1.638 m)   Wt 220 lb (99.8 kg)   LMP 10/16/2020 (Approximate)   BMI 37.18 kg/m  Wt Readings from Last 3 Encounters:  11/15/20 220 lb (99.8 kg)  11/14/20 220 lb (99.8 kg)  11/09/20 220 lb (99.8 kg)    Health Maintenance Due  Topic Date Due   HIV Screening  Never done   Hepatitis C Screening  Never done   COVID-19 Vaccine (4 - Booster for Pfizer series) 02/23/2020   INFLUENZA VACCINE  10/01/2020    There are no preventive care reminders to display for this patient.   Lab Results  Component Value Date   TSH 2.570 06/27/2020   Lab Results  Component Value Date   WBC 9.5 11/09/2020   HGB 14.6 11/09/2020   HCT 44.1 11/09/2020   MCV 95.0 11/09/2020   PLT 257 11/09/2020   Lab Results  Component Value Date   NA 140 06/27/2020   K 4.7 06/27/2020   CO2 21 06/27/2020   GLUCOSE 101 (H) 06/27/2020   BUN 16 06/27/2020   CREATININE 0.93 06/27/2020   BILITOT 0.3 06/27/2020   ALKPHOS 81 06/27/2020   AST 18 06/27/2020   ALT 21 06/27/2020   PROT 6.8 06/27/2020   ALBUMIN 4.5 06/27/2020   CALCIUM 9.6 06/27/2020   EGFR 76 06/27/2020   Lab Results  Component Value Date   CHOL 179 06/27/2020   Lab Results  Component Value Date   HDL 49 06/27/2020   Lab Results  Component Value Date   LDLCALC 110 (H) 06/27/2020   Lab  Results   Component Value Date   TRIG 111 06/27/2020   Lab Results  Component Value Date   CHOLHDL 3.7 06/27/2020   Lab Results  Component Value Date   HGBA1C 5.5 06/27/2020       Assessment & Plan:   Problem List Items Addressed This Visit       Other   COVID-19 - Primary    -positive home test yesterday -Rx. Ibuprofen, tessalon, and paxlovid       Relevant Medications   benzonatate (TESSALON) 100 MG capsule   ibuprofen (ADVIL) 600 MG tablet   nirmatrelvir/ritonavir EUA (PAXLOVID) 20 x 150 MG & 10 x 100MG TABS     Meds ordered this encounter  Medications   benzonatate (TESSALON) 100 MG capsule    Sig: Take 1 capsule (100 mg total) by mouth 2 (two) times daily as needed for cough.    Dispense:  20 capsule    Refill:  0   ibuprofen (ADVIL) 600 MG tablet    Sig: Take 1 tablet (600 mg total) by mouth every 8 (eight) hours as needed for headache, mild pain or moderate pain.    Dispense:  30 tablet    Refill:  0   nirmatrelvir/ritonavir EUA (PAXLOVID) 20 x 150 MG & 10 x 100MG TABS    Sig: Take 3 tablets by mouth 2 (two) times daily for 5 days. (Take nirmatrelvir 150 mg two tablets twice daily for 5 days and ritonavir 100 mg one tablet twice daily for 5 days) Patient GFR is 76    Dispense:  30 tablet    Refill:  0   Date:  11/15/2020   Location of Patient: Home Location of Provider: Office Consent was obtain for visit to be over via telehealth. I verified that I am speaking with the correct person using two identifiers.  I connected with  Cheryl Ballard on 11/15/20 via telephone and verified that I am speaking with the correct person using two identifiers.   I discussed the limitations of evaluation and management by telemedicine. The patient expressed understanding and agreed to proceed.  Time spent: 8 minutes   Noreene Larsson, NP

## 2020-11-20 ENCOUNTER — Encounter (INDEPENDENT_AMBULATORY_CARE_PROVIDER_SITE_OTHER): Payer: Self-pay | Admitting: *Deleted

## 2020-11-20 ENCOUNTER — Encounter (HOSPITAL_COMMUNITY): Payer: Self-pay | Admitting: Internal Medicine

## 2020-11-27 ENCOUNTER — Encounter: Payer: Self-pay | Admitting: Nurse Practitioner

## 2020-12-03 ENCOUNTER — Ambulatory Visit: Payer: No Typology Code available for payment source | Admitting: Nurse Practitioner

## 2020-12-18 NOTE — Progress Notes (Signed)
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  Patient last seen in clinic 11/2019 and was doing well. BP has been running 130's/80-90's. Has gained 40 lbs in the past 1 1/2 yrs. Going to [employer] for a week and then going to  start sax[place] for weight loss. No regular exercise. Working full time and taking care of her mother with parkinson's.  Past Medical History:  Diagnosis Date   Anxiety    cycles around   Back pain    Depression    Diverticulitis    Hypertension    Irregular menses 11/22/2014   S/P colostomy [facility])    had colostomy with surgery, but has been reversed   Sciatica 11/22/2014    Past Surgical History:  Procedure Laterality Date   BOWEL RESECTION     COLON SURGERY  04/2010   COLONOSCOPY WITH PROPOFOL N/A 11/14/2020   Procedure: COLONOSCOPY WITH PROPOFOL;  Surgeon: Rogene Houston, MD;  Location: [facility];  Service: Endoscopy;  Laterality: N/A;  7:30   Colostomy in April     reversal in April 2013   POLYPECTOMY  11/14/2020   Procedure: POLYPECTOMY;  Surgeon: Rogene Houston, MD;  Location: [facility];  Service: Endoscopy;;  hepatic flexure    Current Medications: Current Meds  Medication Sig   escitalopram (LEXAPRO) 20 MG tablet TAKE ONE (1) TABLET BY MOUTH EVERY DAY   ibuprofen (ADVIL) 600 MG tablet Take 1 tablet (600 mg total) by mouth every 8  (eight) hours as needed for headache, mild pain or moderate pain.   metoprolol succinate (TOPROL-XL) 50 MG 24 hr tablet TAKE 1 TABLET BY MOUTH IMMEDIATELY FOLLOWING A MEAL     Allergies:   Patient has no known allergies.   Social History   Socioeconomic History   Marital status: Married    Spouse name: Cheryl Ballard    Number of children: 0   Years of education: 16   Highest education level: Bachelor's degree (e.g., BA, AB, BS)  Occupational History   Occupation: Marine scientist in [employer]    Comment: 23 years   Tobacco Use   Smoking status: Some Days    Packs/day: 0.00    Years: 23.00    Pack years: 0.00    Types: Cigarettes   Smokeless tobacco: Never   Tobacco comments:    Patient states that she is trying to quit.  Vaping Use   Vaping Use: Never used  Substance and Sexual Activity   Alcohol use: Yes    Alcohol/week: 1.0 standard drink    Types: 1 Cans of beer per week    Comment: occasional   Drug use: No   Sexual activity: Yes    Birth control/protection: None  Other Topics Concern   Not on  file  Social History Narrative   Lives with Cheryl Ballard, husband of 4 years in May of 2020.   Great Dane named, Diesel who is 47 years old.      Main caregiver for mom-who has parkinson's and DDD, she is 68 years old (2020).      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       Wears seat belt.   Wears sunscreen.   Eats fruits and veggies.   Consumes milk regularly.     Social Determinants of Health   Financial Resource Strain: Low Risk    Difficulty of Paying Living Expenses: Not hard at all  Food Insecurity: No Food Insecurity   Worried About Charity fundraiser in the Last Year: Never true   [place] in the Last Year: Never true  Transportation Needs: No Transportation Needs   Lack of Transportation (Medical): No   Lack of Transportation (Non-Medical): No  Physical Activity: Unknown   Days of Exercise per Week: 3 days   Minutes of  Exercise per Session: Not on file  Stress: No Stress Concern Present   Feeling of Stress : Only a little  Social Connections: Moderately Integrated   Frequency of Communication with Friends and Family: More than three times a week   Frequency of Social Gatherings with Friends and Family: More than three times a week   Attends Religious Services: More than 4 times per year   Active Member of [employer] or Organizations: No   Attends Music therapist: Never   Marital Status: Married     Family History:  The patient's  family history includes Arthritis in her mother; Early death in her brother; Heart disease in her brother, maternal grandmother, and mother; Parkinson's disease in her mother.   ROS:   Please see the history of present illness.    ROS All other systems reviewed and are negative.   PHYSICAL EXAM:   VS:  BP (!) 144/98   Pulse 70   Ht 5\' 5"  (1.651 m)   Wt 224 lb (101.6 kg)   SpO2 96%   BMI 37.28 kg/m   Physical Exam  GEN: Obese, in no acute distress  Neck: no JVD, carotid bruits, or masses Cardiac:RRR; no murmurs, rubs, or gallops  Respiratory:  clear to auscultation bilaterally, normal work of breathing GI: soft, nontender, nondistended, + BS Ext: without cyanosis, clubbing, or edema, Good distal pulses bilaterally Neuro:  Alert and Oriented x 3 Psych: euthymic mood, full affect  Wt Readings from Last 3 Encounters:  12/24/20 224 lb (101.6 kg)  11/15/20 220 lb (99.8 kg)  11/14/20 220 lb (99.8 kg)      Studies/Labs Reviewed:   EKG:  EKG is not ordered today.   Recent Labs: 06/27/2020: ALT 21; BUN 16; Creatinine, Ser 0.93; Potassium 4.7; Sodium 140; TSH 2.570 11/09/2020: Hemoglobin 14.6; Platelets 257   Lipid Panel    Component Value Date/Time   CHOL 179 06/27/2020 0931   TRIG 111 06/27/2020 0931   HDL 49 06/27/2020 0931   CHOLHDL 3.7 06/27/2020 0931   CHOLHDL 2 05/13/2012 1251   VLDL 11.8 05/13/2012 1251   LDLCALC 110 (H) 06/27/2020 0931     Additional studies/ records that were reviewed today include:  Echocardiogram 07/11/2016  Left ventricle: The cavity size was normal. Wall thickness was   normal. Systolic function was vigorous. The estimated ejection   fraction was in the range of 65% to 70%. Wall motion  was normal;   there were no regional wall motion abnormalities. Left   ventricular diastolic function parameters were normal. - Right ventricle: The cavity size was normal. Wall thickness was   normal.     Risk Assessment/Calculations:         ASSESSMENT:    1. Essential hypertension   2. Palpitations   3. Situational anxiety      PLAN:  In order of problems listed above:  HTN-BP running high recently. She's gained 40 lbs and is going to start [place] after vacation. Recommend 2 gm sodium diet, 150 min exercise, weight loss. She'll keep track of BP and Pulse. Can increase metoprolol to 75 mg if it continues to run high.  Palpitations doing well on metoprolol  Anxiety depression managed by PCP  Shared Decision Making/Informed Consent        Medication Adjustments/Labs and Tests Ordered: Current medicines are reviewed at length with the patient today.  Concerns regarding medicines are outlined above.  Medication changes, Labs and Tests ordered today are listed in the Patient Instructions below. Patient Instructions  Medication Instructions:  Blood Pressure goal under 135/85   *If you need a refill on your cardiac medications before your next appointment, please call your pharmacy*   Lab Work: NONE   If you have labs (blood work) drawn today and your tests are completely normal, you will receive your results only by: [place] (if you have MyChart) OR A paper copy in the mail If you have any lab test that is abnormal or we need to change your treatment, we will call you to review the results.   Testing/Procedures: NONE    Follow-Up: At [facility], you and your health needs  are our priority.  As part of our continuing mission to provide you with exceptional heart care, we have created designated Provider Care Teams.  These Care Teams include your primary Cardiologist (physician) and Advanced Practice Providers (APPs -  Physician Assistants and Nurse Practitioners) who all work together to provide you with the care you need, when you need it.  We recommend signing up for the patient portal called "MyChart".  Sign up information is provided on this After Visit Summary.  MyChart is used to connect with patients for Virtual Visits (Telemedicine).  Patients are able to view lab/test results, encounter notes, upcoming appointments, etc.  Non-urgent messages can be sent to your provider as well.   To learn more about what you can do with MyChart, go to [email or web address].    Your next appointment:   3 month(s)  The format for your next appointment:   In Person  Provider:   Ermalinda Barrios, PA-C or Dr. Claiborne Billings    Other Instructions  150 Minutes of exercise weekly      Two Gram Sodium Diet 2000 mg  What is Sodium? Sodium is a mineral found naturally in many foods. The most significant source of sodium in the diet is table salt, which is about 40% sodium.  Processed, convenience, and preserved foods also contain a large amount of sodium.  The body needs only 500 mg of sodium daily to function,  A normal diet provides more than enough sodium even if you do not use salt.  Why Limit Sodium? A build up of sodium in the body can cause thirst, increased blood pressure, shortness of breath, and water retention.  Decreasing sodium in the diet can reduce edema and risk of heart attack or stroke associated with high blood pressure.  Keep in mind that there are many other factors involved in these health problems.  Heredity, obesity, lack of exercise, cigarette smoking, stress and what you eat all play a role.  General Guidelines: Do not add salt at the table or in cooking.   One teaspoon of salt contains over 2 grams of sodium. Read food labels Avoid processed and convenience foods Ask your dietitian before eating any foods not dicussed in the menu planning guidelines Consult your physician if you wish to use a salt substitute or a sodium containing medication such as antacids.  Limit milk and milk products to 16 oz (2 cups) per day.  Shopping Hints: READ LABELS!! "Dietetic" does not necessarily mean low sodium. Salt and other sodium ingredients are often added to foods during processing.    Menu Planning Guidelines Food Group Choose More Often Avoid  Beverages (see also the milk group All fruit juices, low-sodium, salt-free vegetables juices, low-sodium carbonated beverages Regular vegetable or tomato juices, commercially softened water used for drinking or cooking  Breads and Cereals Enriched white, wheat, rye and pumpernickel bread, hard rolls and dinner rolls; muffins, cornbread and waffles; most dry cereals, cooked cereal without added salt; unsalted crackers and breadsticks; low sodium or homemade bread crumbs Bread, rolls and crackers with salted tops; quick breads; instant hot cereals; pancakes; commercial bread stuffing; self-rising flower and biscuit mixes; regular bread crumbs or cracker crumbs  Desserts and Sweets Desserts and sweets mad with mild should be within allowance Instant pudding mixes and cake mixes  Fats Butter or margarine; vegetable oils; unsalted salad dressings, regular salad dressings limited to 1 Tbs; light, sour and heavy cream Regular salad dressings containing bacon fat, bacon bits, and salt pork; snack dips made with instant soup mixes or processed cheese; salted nuts  Fruits Most fresh, frozen and canned fruits Fruits processed with salt or sodium-containing ingredient (some dried fruits are processed with sodium sulfites        Vegetables Fresh, frozen vegetables and low- sodium canned vegetables Regular canned vegetables,  sauerkraut, pickled vegetables, and others prepared in brine; frozen vegetables in sauces; vegetables seasoned with ham, bacon or salt pork  Condiments, Sauces, Miscellaneous  Salt substitute with physician's approval; pepper, herbs, spices; vinegar, lemon or lime juice; hot pepper sauce; garlic powder, onion powder, low sodium soy sauce (1 Tbs.); low sodium condiments (ketchup, chili sauce, mustard) in limited amounts (1 tsp.) fresh ground horseradish; unsalted tortilla chips, pretzels, potato chips, popcorn, salsa (1/4 cup) Any seasoning made with salt including garlic salt, celery salt, onion salt, and seasoned salt; sea salt, rock salt, kosher salt; meat tenderizers; monosodium glutamate; mustard, regular soy sauce, barbecue, sauce, chili sauce, teriyaki sauce, steak sauce, Worcestershire sauce, and most flavored vinegars; canned gravy and mixes; regular condiments; salted snack foods, olives, picles, relish, horseradish sauce, catsup   Food preparation: Try these seasonings Meats:    Pork Sage, onion Serve with applesauce  Chicken Poultry seasoning, thyme, parsley Serve with cranberry sauce  Lamb Curry powder, rosemary, garlic, thyme Serve with mint sauce or jelly  Veal Marjoram, basil Serve with current jelly, cranberry sauce  Beef Pepper, bay leaf Serve with dry mustard, unsalted chive butter  Fish [place] leaf, dill Serve with unsalted lemon butter, unsalted parsley butter  Vegetables:    Asparagus Lemon juice   Broccoli Lemon juice   Carrots Mustard dressing parsley, mint, nutmeg, glazed with unsalted butter and sugar   Green beans Marjoram, lemon juice, nutmeg,dill seed   Tomatoes Basil, marjoram, onion  Spice /blend for "Salt Shaker" 4 tsp ground thyme 1 tsp ground sage 3 tsp ground rosemary 4 tsp ground marjoram   Test your knowledge A product that says "Salt Free" may still contain sodium. True or False Garlic Powder and Hot Pepper Sauce an be used as alternative seasonings.True or  False Processed foods have more sodium than fresh foods.  True or False Canned Vegetables have less sodium than froze True or False   WAYS TO DECREASE YOUR SODIUM INTAKE Avoid the use of added salt in cooking and at the table.  Table salt (and other prepared seasonings which contain salt) is probably one of the greatest sources of sodium in the diet.  Unsalted foods can gain flavor from the sweet, sour, and butter taste sensations of herbs and spices.  Instead of using salt for seasoning, try the following seasonings with the foods listed.  Remember: how you use them to enhance natural food flavors is limited only by your creativity... Allspice-Meat, fish, eggs, fruit, peas, red and yellow vegetables Almond Extract-Fruit baked goods Anise Seed-Sweet breads, fruit, carrots, beets, cottage cheese, cookies (tastes like licorice) Basil-Meat, fish, eggs, vegetables, rice, vegetables salads, soups, sauces Bay Leaf-Meat, fish, stews, poultry Burnet-Salad, vegetables (cucumber-like flavor) Caraway Seed-Bread, cookies, cottage cheese, meat, vegetables, cheese, rice Cardamon-Baked goods, fruit, soups Celery Powder or seed-Salads, salad dressings, sauces, meatloaf, soup, bread.Do not use  celery salt Chervil-Meats, salads, fish, eggs, vegetables, cottage cheese (parsley-like flavor) Chili Power-Meatloaf, chicken cheese, corn, eggplant, egg dishes Chives-Salads cottage cheese, egg dishes, soups, vegetables, sauces Cilantro-Salsa, casseroles Cinnamon-Baked goods, fruit, pork, lamb, chicken, carrots Cloves-Fruit, baked goods, fish, pot roast, green beans, beets, carrots Coriander-Pastry, cookies, meat, salads, cheese (lemon-orange flavor) Cumin-Meatloaf, fish,cheese, eggs, cabbage,fruit pie (caraway flavor) [employer], fruit, eggs, fish, poultry, cottage cheese, vegetables Dill Seed-Meat, cottage cheese, poultry, vegetables, fish, salads, bread Fennel Seed-Bread, cookies, apples, pork, eggs, fish,  beets, cabbage, cheese, Licorice-like flavor Garlic-(buds or powder) Salads, meat, poultry, fish, bread, butter, vegetables, potatoes.Do not  use garlic salt Ginger-Fruit, vegetables, baked goods, meat, fish, poultry Horseradish Root-Meet, vegetables, butter Lemon Juice or Extract-Vegetables, fruit, tea, baked goods, fish salads Mace-Baked goods fruit, vegetables, fish, poultry (taste like nutmeg) Maple Extract-Syrups Marjoram-Meat, chicken, fish, vegetables, breads, green salads (taste like Sage) Mint-Tea, lamb, sherbet, vegetables, desserts, carrots, cabbage Mustard, Dry or Seed-Cheese, eggs, meats, vegetables, poultry Nutmeg-Baked goods, fruit, chicken, eggs, vegetables, desserts Onion Powder-Meat, fish, poultry, vegetables, cheese, eggs, bread, rice salads (Do not use   Onion salt) Orange Extract-Desserts, baked goods Oregano-Pasta, eggs, cheese, onions, pork, lamb, fish, chicken, vegetables, green salads Paprika-Meat, fish, poultry, eggs, cheese, vegetables Parsley Flakes-Butter, vegetables, meat fish, poultry, eggs, bread, salads (certain forms may   Contain sodium Pepper-Meat fish, poultry, vegetables, eggs Peppermint Extract-Desserts, baked goods Poppy Seed-Eggs, bread, cheese, fruit dressings, baked goods, noodles, vegetables, cottage  [employer], poultry, meat, fish, cauliflower, turnips,eggs bread Saffron-Rice, bread, veal, chicken, fish, eggs Sage-Meat, fish, poultry, onions, eggplant, tomateos, pork, stews Savory-Eggs, salads, poultry, meat, rice, vegetables, soups, pork Tarragon-Meat, poultry, fish, eggs, butter, vegetables (licorice-like flavor)  Thyme-Meat, poultry, fish, eggs, vegetables, (clover-like flavor), sauces, soups Tumeric-Salads, butter, eggs, fish, rice, vegetables (saffron-like flavor) Vanilla Extract-Baked goods, candy Vinegar-Salads, vegetables, meat marinades Walnut Extract-baked goods, candy   2. Choose your  Foods Wisely   The following is a list of foods to avoid which are high in sodium:  Meats-Avoid all smoked, canned, salt cured, dried and kosher meat and fish as well as Anchovies   Lox Berniece Salines  Luncheon meats:Bologna, Liverwurst, Pastrami Canned meat or fish  Marinated herring Caviar    Pepperoni Corned Beef   Pizza Dried chipped beef  Salami Frozen breaded fish or meat Salt pork Frankfurters or hot dogs  Sardines Gefilte fish   Sausage Ham (boiled ham, Proscuitto Smoked butt    spiced ham)   Spam      TV Dinners Vegetables Canned vegetables (Regular) Relish Canned mushrooms  Sauerkraut Olives    Tomato juice Pickles  Bakery and Dessert Products Canned puddings  Cream pies Cheesecake   Decorated cakes Cookies  Beverages/Juices Tomato juice, regular  Gatorade   V-8 vegetable juice, regular  Breads and Cereals Biscuit mixes   Salted potato chips, corn chips, pretzels Bread stuffing mixes  Salted crackers and rolls Pancake and waffle mixes Self-rising flour  Seasonings Accent    Meat sauces Barbecue sauce  Meat tenderizer Catsup    Monosodium glutamate (MSG) Celery salt   Onion salt Chili sauce   Prepared mustard Garlic salt   Salt, seasoned salt, sea salt Gravy mixes   Soy sauce Horseradish   Steak sauce Ketchup   Tartar sauce Lite salt    Teriyaki sauce Marinade mixes   Worcestershire sauce  Others Baking powder   Cocoa and cocoa mixes Baking soda   Commercial casserole mixes Candy-caramels, chocolate  Dehydrated soups    Bars, fudge,nougats  Instant rice and pasta mixes Canned broth or soup  Maraschino cherries Cheese, aged and processed cheese and cheese spreads  Learning Assessment Quiz  Indicated T (for True) or F (for False) for each of the following statements:  _____ Fresh fruits and vegetables and unprocessed grains are generally low in sodium _____ Water may contain a considerable amount of sodium, depending on the source _____ You can always  tell if a food is high in sodium by tasting it _____ Certain laxatives my be high in sodium and should be avoided unless prescribed   by a physician or pharmacist _____ Salt substitutes may be used freely by anyone on a sodium restricted diet _____ Sodium is present in table salt, food additives and as a natural component of   most foods _____ Table salt is approximately 90% sodium _____ Limiting sodium intake may help prevent excess fluid accumulation in the body _____ On a sodium-restricted diet, seasonings such as bouillon soy sauce, and    cooking wine should be used in place of table salt _____ On an ingredient list, a product which lists monosodium glutamate as the first   ingredient is an appropriate food to include on a low sodium diet  Circle the best answer(s) to the following statements (Hint: there may be more than one correct answer)  11. On a low-sodium diet, some acceptable snack items are:    A. Olives  F. Bean dip   K. Grapefruit juice    B. Salted Pretzels G. Commercial Popcorn   L. Canned peaches    C. Carrot Sticks  H. Bouillon   M. Unsalted nuts   D. [place] fries  I. Peanut butter crackers N. Salami   E. Sweet pickles J. Tomato Juice   O. Pizza  12.  Seasonings that may be used freely on a reduced - sodium diet include   A. Lemon wedges F.Monosodium glutamate K. Celery seed    B.Soysauce   G. Pepper   L. Mustard powder   C. Sea salt  H. Cooking wine  M. Onion flakes   D. Vinegar  E. Prepared horseradish  N. Salsa   E. Sage   J. Worcestershire sauce  O. [address]    Sumner Boast, PA-C  12/24/2020 11:52 AM    [facility] [place], [place], [place]  [address] Phone: [phone]; Fax: [phone]

## 2020-12-24 ENCOUNTER — Other Ambulatory Visit: Payer: Self-pay

## 2020-12-24 ENCOUNTER — Encounter: Payer: Self-pay | Admitting: Physician Assistant

## 2020-12-24 ENCOUNTER — Other Ambulatory Visit: Payer: Self-pay | Admitting: Physician Assistant

## 2020-12-24 ENCOUNTER — Ambulatory Visit (INDEPENDENT_AMBULATORY_CARE_PROVIDER_SITE_OTHER): Payer: No Typology Code available for payment source | Admitting: Physician Assistant

## 2020-12-24 VITALS — BP 144/98 | HR 70 | Ht 65.0 in | Wt 224.0 lb

## 2020-12-24 DIAGNOSIS — R002 Palpitations: Secondary | ICD-10-CM

## 2020-12-24 DIAGNOSIS — F418 Other specified anxiety disorders: Secondary | ICD-10-CM

## 2020-12-24 DIAGNOSIS — I10 Essential (primary) hypertension: Secondary | ICD-10-CM

## 2020-12-24 NOTE — Patient Instructions (Signed)
Medication Instructions:  Blood Pressure goal under 135/85   *If you need a refill on your cardiac medications before your next appointment, please call your pharmacy*   Lab Work: NONE   If you have labs (blood work) drawn today and your tests are completely normal, you will receive your results only by: [place] (if you have MyChart) OR A paper copy in the mail If you have any lab test that is abnormal or we need to change your treatment, we will call you to review the results.   Testing/Procedures: NONE    Follow-Up: At [facility], you and your health needs are our priority.  As part of our continuing mission to provide you with exceptional heart care, we have created designated Provider Care Teams.  These Care Teams include your primary Cardiologist (physician) and Advanced Practice Providers (APPs -  Physician Assistants and Nurse Practitioners) who all work together to provide you with the care you need, when you need it.  We recommend signing up for the patient portal called "MyChart".  Sign up information is provided on this After Visit Summary.  MyChart is used to connect with patients for Virtual Visits (Telemedicine).  Patients are able to view lab/test results, encounter notes, upcoming appointments, etc.  Non-urgent messages can be sent to your provider as well.   To learn more about what you can do with MyChart, go to [email or web address].    Your next appointment:   3 month(s)  The format for your next appointment:   In Person  Provider:   Ermalinda Barrios, PA-C or Dr. Claiborne Billings    Other Instructions  150 Minutes of exercise weekly      Two Gram Sodium Diet 2000 mg  What is Sodium? Sodium is a mineral found naturally in many foods. The most significant source of sodium in the diet is table salt, which is about 40% sodium.  Processed, convenience, and preserved foods also contain a large amount of sodium.  The body needs only 500 mg of sodium daily to  function,  A normal diet provides more than enough sodium even if you do not use salt.  Why Limit Sodium? A build up of sodium in the body can cause thirst, increased blood pressure, shortness of breath, and water retention.  Decreasing sodium in the diet can reduce edema and risk of heart attack or stroke associated with high blood pressure.  Keep in mind that there are many other factors involved in these health problems.  Heredity, obesity, lack of exercise, cigarette smoking, stress and what you eat all play a role.  General Guidelines: Do not add salt at the table or in cooking.  One teaspoon of salt contains over 2 grams of sodium. Read food labels Avoid processed and convenience foods Ask your dietitian before eating any foods not dicussed in the menu planning guidelines Consult your physician if you wish to use a salt substitute or a sodium containing medication such as antacids.  Limit milk and milk products to 16 oz (2 cups) per day.  Shopping Hints: READ LABELS!! "Dietetic" does not necessarily mean low sodium. Salt and other sodium ingredients are often added to foods during processing.    Menu Planning Guidelines Food Group Choose More Often Avoid  Beverages (see also the milk group All fruit juices, low-sodium, salt-free vegetables juices, low-sodium carbonated beverages Regular vegetable or tomato juices, commercially softened water used for drinking or cooking  Breads and Cereals Enriched white, wheat, rye and pumpernickel bread, hard  rolls and dinner rolls; muffins, cornbread and waffles; most dry cereals, cooked cereal without added salt; unsalted crackers and breadsticks; low sodium or homemade bread crumbs Bread, rolls and crackers with salted tops; quick breads; instant hot cereals; pancakes; commercial bread stuffing; self-rising flower and biscuit mixes; regular bread crumbs or cracker crumbs  Desserts and Sweets Desserts and sweets mad with mild should be within allowance  Instant pudding mixes and cake mixes  Fats Butter or margarine; vegetable oils; unsalted salad dressings, regular salad dressings limited to 1 Tbs; light, sour and heavy cream Regular salad dressings containing bacon fat, bacon bits, and salt pork; snack dips made with instant soup mixes or processed cheese; salted nuts  Fruits Most fresh, frozen and canned fruits Fruits processed with salt or sodium-containing ingredient (some dried fruits are processed with sodium sulfites        Vegetables Fresh, frozen vegetables and low- sodium canned vegetables Regular canned vegetables, sauerkraut, pickled vegetables, and others prepared in brine; frozen vegetables in sauces; vegetables seasoned with ham, bacon or salt pork  Condiments, Sauces, Miscellaneous  Salt substitute with physician's approval; pepper, herbs, spices; vinegar, lemon or lime juice; hot pepper sauce; garlic powder, onion powder, low sodium soy sauce (1 Tbs.); low sodium condiments (ketchup, chili sauce, mustard) in limited amounts (1 tsp.) fresh ground horseradish; unsalted tortilla chips, pretzels, potato chips, popcorn, salsa (1/4 cup) Any seasoning made with salt including garlic salt, celery salt, onion salt, and seasoned salt; sea salt, rock salt, kosher salt; meat tenderizers; monosodium glutamate; mustard, regular soy sauce, barbecue, sauce, chili sauce, teriyaki sauce, steak sauce, Worcestershire sauce, and most flavored vinegars; canned gravy and mixes; regular condiments; salted snack foods, olives, picles, relish, horseradish sauce, catsup   Food preparation: Try these seasonings Meats:    Pork Sage, onion Serve with applesauce  Chicken Poultry seasoning, thyme, parsley Serve with cranberry sauce  Lamb Curry powder, rosemary, garlic, thyme Serve with mint sauce or jelly  Veal Marjoram, basil Serve with current jelly, cranberry sauce  Beef Pepper, bay leaf Serve with dry mustard, unsalted chive butter  Fish [place] leaf, dill  Serve with unsalted lemon butter, unsalted parsley butter  Vegetables:    Asparagus Lemon juice   Broccoli Lemon juice   Carrots Mustard dressing parsley, mint, nutmeg, glazed with unsalted butter and sugar   Green beans Marjoram, lemon juice, nutmeg,dill seed   Tomatoes Basil, marjoram, onion   Spice /blend for [employer]" 4 tsp ground thyme 1 tsp ground sage 3 tsp ground rosemary 4 tsp ground marjoram   Test your knowledge A product that says "Salt Free" may still contain sodium. True or False Garlic Powder and Hot Pepper Sauce an be used as alternative seasonings.True or False Processed foods have more sodium than fresh foods.  True or False Canned Vegetables have less sodium than froze True or False   WAYS TO DECREASE YOUR SODIUM INTAKE Avoid the use of added salt in cooking and at the table.  Table salt (and other prepared seasonings which contain salt) is probably one of the greatest sources of sodium in the diet.  Unsalted foods can gain flavor from the sweet, sour, and butter taste sensations of herbs and spices.  Instead of using salt for seasoning, try the following seasonings with the foods listed.  Remember: how you use them to enhance natural food flavors is limited only by your creativity... Allspice-Meat, fish, eggs, fruit, peas, red and yellow vegetables Almond Extract-Fruit baked goods Anise Seed-Sweet breads, fruit, carrots,  beets, cottage cheese, cookies (tastes like licorice) Basil-Meat, fish, eggs, vegetables, rice, vegetables salads, soups, sauces Bay Leaf-Meat, fish, stews, poultry Burnet-Salad, vegetables (cucumber-like flavor) Caraway Seed-Bread, cookies, cottage cheese, meat, vegetables, cheese, rice Cardamon-Baked goods, fruit, soups Celery Powder or seed-Salads, salad dressings, sauces, meatloaf, soup, bread.Do not use  celery salt Chervil-Meats, salads, fish, eggs, vegetables, cottage cheese (parsley-like flavor) Chili Power-Meatloaf, chicken cheese, corn,  eggplant, egg dishes Chives-Salads cottage cheese, egg dishes, soups, vegetables, sauces Cilantro-Salsa, casseroles Cinnamon-Baked goods, fruit, pork, lamb, chicken, carrots Cloves-Fruit, baked goods, fish, pot roast, green beans, beets, carrots Coriander-Pastry, cookies, meat, salads, cheese (lemon-orange flavor) Cumin-Meatloaf, fish,cheese, eggs, cabbage,fruit pie (caraway flavor) [employer], fruit, eggs, fish, poultry, cottage cheese, vegetables Dill Seed-Meat, cottage cheese, poultry, vegetables, fish, salads, bread Fennel Seed-Bread, cookies, apples, pork, eggs, fish, beets, cabbage, cheese, Licorice-like flavor Garlic-(buds or powder) Salads, meat, poultry, fish, bread, butter, vegetables, potatoes.Do not  use garlic salt Ginger-Fruit, vegetables, baked goods, meat, fish, poultry Horseradish Root-Meet, vegetables, butter Lemon Juice or Extract-Vegetables, fruit, tea, baked goods, fish salads Mace-Baked goods fruit, vegetables, fish, poultry (taste like nutmeg) Maple Extract-Syrups Marjoram-Meat, chicken, fish, vegetables, breads, green salads (taste like Sage) Mint-Tea, lamb, sherbet, vegetables, desserts, carrots, cabbage Mustard, Dry or Seed-Cheese, eggs, meats, vegetables, poultry Nutmeg-Baked goods, fruit, chicken, eggs, vegetables, desserts Onion Powder-Meat, fish, poultry, vegetables, cheese, eggs, bread, rice salads (Do not use   Onion salt) Orange Extract-Desserts, baked goods Oregano-Pasta, eggs, cheese, onions, pork, lamb, fish, chicken, vegetables, green salads Paprika-Meat, fish, poultry, eggs, cheese, vegetables Parsley Flakes-Butter, vegetables, meat fish, poultry, eggs, bread, salads (certain forms may   Contain sodium Pepper-Meat fish, poultry, vegetables, eggs Peppermint Extract-Desserts, baked goods Poppy Seed-Eggs, bread, cheese, fruit dressings, baked goods, noodles, vegetables, cottage  [employer], poultry,  meat, fish, cauliflower, turnips,eggs bread Saffron-Rice, bread, veal, chicken, fish, eggs Sage-Meat, fish, poultry, onions, eggplant, tomateos, pork, stews Savory-Eggs, salads, poultry, meat, rice, vegetables, soups, pork Tarragon-Meat, poultry, fish, eggs, butter, vegetables (licorice-like flavor)  Thyme-Meat, poultry, fish, eggs, vegetables, (clover-like flavor), sauces, soups Tumeric-Salads, butter, eggs, fish, rice, vegetables (saffron-like flavor) Vanilla Extract-Baked goods, candy Vinegar-Salads, vegetables, meat marinades Walnut Extract-baked goods, candy   2. Choose your Foods Wisely   The following is a list of foods to avoid which are high in sodium:  Meats-Avoid all smoked, canned, salt cured, dried and kosher meat and fish as well as Anchovies   [employer] meats:Bologna, Liverwurst, Pastrami Canned meat or fish  Marinated herring Caviar    Pepperoni Corned Beef   Pizza Dried chipped beef  Salami Frozen breaded fish or meat Salt pork Frankfurters or hot dogs  Sardines Gefilte fish   Sausage Ham (boiled ham, Proscuitto Smoked butt    spiced ham)   Spam      TV Dinners Vegetables Canned vegetables (Regular) Relish Canned mushrooms  Sauerkraut Olives    Tomato juice Pickles  Bakery and Dessert Products Canned puddings  Cream pies Cheesecake   Decorated cakes Cookies  Beverages/Juices Tomato juice, regular  Gatorade   V-8 vegetable juice, regular  Breads and Cereals Biscuit mixes   Salted potato chips, corn chips, pretzels Bread stuffing mixes  Salted crackers and rolls Pancake and waffle mixes Self-rising flour  Seasonings Accent    Meat sauces Barbecue sauce  Meat tenderizer Catsup    Monosodium glutamate (MSG) Celery salt   Onion salt Chili sauce   Prepared mustard Garlic salt   Salt, seasoned salt, sea salt Gravy mixes   Soy sauce  Horseradish   Steak sauce Ketchup   Tartar sauce Lite salt    Teriyaki sauce Marinade  mixes   Worcestershire sauce  Others Baking powder   Cocoa and cocoa mixes Baking soda   Commercial casserole mixes Candy-caramels, chocolate  Dehydrated soups    Bars, fudge,nougats  Instant rice and pasta mixes Canned broth or soup  Maraschino cherries Cheese, aged and processed cheese and cheese spreads  Learning Assessment Quiz  Indicated T (for True) or F (for False) for each of the following statements:  _____ Fresh fruits and vegetables and unprocessed grains are generally low in sodium _____ Water may contain a considerable amount of sodium, depending on the source _____ You can always tell if a food is high in sodium by tasting it _____ Certain laxatives my be high in sodium and should be avoided unless prescribed   by a physician or pharmacist _____ Salt substitutes may be used freely by anyone on a sodium restricted diet _____ Sodium is present in table salt, food additives and as a natural component of   most foods _____ Table salt is approximately 90% sodium _____ Limiting sodium intake may help prevent excess fluid accumulation in the body _____ On a sodium-restricted diet, seasonings such as bouillon soy sauce, and    cooking wine should be used in place of table salt _____ On an ingredient list, a product which lists monosodium glutamate as the first   ingredient is an appropriate food to include on a low sodium diet  Circle the best answer(s) to the following statements (Hint: there may be more than one correct answer)  11. On a low-sodium diet, some acceptable snack items are:    A. Olives  F. Bean dip   K. Grapefruit juice    B. Salted Pretzels G. Commercial Popcorn   L. Canned peaches    C. Carrot Sticks  H. Bouillon   M. Unsalted nuts   D. [place] fries  I. Peanut butter crackers N. Salami   E. Sweet pickles J. Tomato Juice   O. Pizza  12.  Seasonings that may be used freely on a reduced - sodium diet include   A. Lemon wedges F.Monosodium glutamate K.  Celery seed    B.Soysauce   G. Pepper   L. Mustard powder   C. Sea salt  H. Cooking wine  M. Onion flakes   D. Vinegar  E. Prepared horseradish N. Salsa   E. Sage   J. Worcestershire sauce  O. Chutney

## 2021-01-08 ENCOUNTER — Ambulatory Visit (INDEPENDENT_AMBULATORY_CARE_PROVIDER_SITE_OTHER): Payer: No Typology Code available for payment source | Admitting: Adult Health

## 2021-01-08 ENCOUNTER — Other Ambulatory Visit: Payer: Self-pay

## 2021-01-08 ENCOUNTER — Encounter: Payer: Self-pay | Admitting: Adult Health

## 2021-01-08 VITALS — BP 128/87 | HR 65 | Ht 64.5 in | Wt 223.0 lb

## 2021-01-08 DIAGNOSIS — Z713 Dietary counseling and surveillance: Secondary | ICD-10-CM | POA: Diagnosis not present

## 2021-01-08 DIAGNOSIS — Z6837 Body mass index (BMI) 37.0-37.9, adult: Secondary | ICD-10-CM | POA: Diagnosis not present

## 2021-01-08 MED ORDER — SAXENDA 18 MG/3ML ~~LOC~~ SOPN: PEN_INJECTOR | SUBCUTANEOUS | 2 refills | Status: DC

## 2021-01-08 NOTE — Progress Notes (Signed)
  Subjective:     Patient ID: Cheryl Ballard, female   DOB: 04-Aug-1975, 45 y.o.   MRN: [id number]  HPI Cheryl Ballard is a 45 year old white female,married, G2P0020, in to talk weight loss, she is interested in saxenda. She has tried and failed, [employer] and Keto with supplements, she will lose weight and gain it back.  Lab Results  Component Value Date   DIAGPAP  06/27/2020    - Negative for intraepithelial lesion or malignancy (NILM)   HPV NOT DETECTED 04/09/2016   [place] Negative 06/27/2020   PCP is Donneta Romberg NP.  Review of Systems Can't lose weight Reviewed past medical,surgical, social and family history. Reviewed medications and allergies.     Objective:   Physical Exam BP 128/87 (BP Location: Left Arm, Patient Position: Sitting, Cuff Size: Large)   Pulse 65   Ht 5' 4.5" (1.638 m)   Wt 223 lb (101.2 kg)   BMI 37.69 kg/m     Skin warm and dry. Lungs: clear to ausculation bilaterally. Cardiovascular: regular rate and rhythm.  Fall risk is low  Upstream - 01/08/21 1047       Pregnancy Intention Screening   Does the patient want to become pregnant in the next year? No    Does the patient's partner want to become pregnant in the next year? No    Would the patient like to discuss contraceptive options today? No      Contraception Wrap Up   Current Method No Method - Other Reason    End Method No Method - Other Reason             Assessment:     1. Weight loss counseling, encounter for Will rx saxenda Meds ordered this encounter  Medications   Liraglutide -Weight Management (SAXENDA) 18 MG/3ML SOPN    Sig: Inject 0.6 mg x 1 week increase by 0.6 mg weekly to max dose of 3 mg week 5    Dispense:  3 mL    Refill:  2    Order Specific Question:   Supervising Provider    Answer:   Elonda Husky, LUTHER H [[id number]]     2. Body mass index 37.0-37.9, adult     Plan:    Follow up in 4 weeks  Gave handout on saxenda

## 2021-01-10 ENCOUNTER — Other Ambulatory Visit: Payer: Self-pay | Admitting: Adult Health

## 2021-01-10 ENCOUNTER — Other Ambulatory Visit (HOSPITAL_COMMUNITY): Payer: Self-pay

## 2021-01-10 ENCOUNTER — Telehealth: Payer: Self-pay | Admitting: Adult Health

## 2021-01-10 MED ORDER — INSULIN PEN NEEDLE 31G X 5 MM MISC
2 refills | Status: DC
  Filled 2021-01-10: qty 100, 90d supply, fill #0
  Filled 2021-08-21: qty 100, 90d supply, fill #1

## 2021-01-10 MED ORDER — SAXENDA 18 MG/3ML ~~LOC~~ SOPN
3.0000 mg | PEN_INJECTOR | Freq: Every day | SUBCUTANEOUS | 5 refills | Status: DC
  Filled 2021-01-10: qty 15, 30d supply, fill #0
  Filled 2021-02-21: qty 15, 30d supply, fill #1
  Filled 2021-03-28: qty 15, 30d supply, fill #2
  Filled 2021-05-01: qty 15, 30d supply, fill #3
  Filled 2021-06-13: qty 15, 30d supply, fill #4
  Filled 2021-07-17: qty 15, 30d supply, fill #5

## 2021-01-10 MED ORDER — SAXENDA 18 MG/3ML ~~LOC~~ SOPN
PEN_INJECTOR | SUBCUTANEOUS | 2 refills | Status: DC
  Filled 2021-01-10: qty 3, 30d supply, fill #0

## 2021-01-10 NOTE — Telephone Encounter (Signed)
Patient calling stating that the script that was sent to [facility] is not able to fill pt is asking to be sent to [facility]

## 2021-01-10 NOTE — Telephone Encounter (Signed)
Saxenda sent to [facility] pharmacy

## 2021-01-10 NOTE — Progress Notes (Signed)
Refilled saxenda

## 2021-01-10 NOTE — Addendum Note (Signed)
Addended by: Derrek Monaco A on: 01/10/2021 10:03 AM   Modules accepted: Orders

## 2021-01-11 ENCOUNTER — Other Ambulatory Visit (HOSPITAL_COMMUNITY): Payer: Self-pay

## 2021-01-11 ENCOUNTER — Telehealth: Payer: Self-pay | Admitting: *Deleted

## 2021-01-11 NOTE — Telephone Encounter (Signed)
Pt's insurance has approved Saxenda injection. Effective for a max of 4 fills from 01/09/21-05/08/21. Approved for 15 mL per 30 days. Iago aware and I left pt a message with approval. JSY

## 2021-01-23 ENCOUNTER — Other Ambulatory Visit (HOSPITAL_COMMUNITY): Payer: Self-pay

## 2021-01-23 ENCOUNTER — Other Ambulatory Visit: Payer: Self-pay | Admitting: *Deleted

## 2021-01-23 MED ORDER — METOPROLOL SUCCINATE ER 50 MG PO TB24
ORAL_TABLET | ORAL | 3 refills | Status: DC
  Filled 2021-01-23: qty 90, fill #0

## 2021-01-31 ENCOUNTER — Other Ambulatory Visit: Payer: Self-pay

## 2021-01-31 MED ORDER — METOPROLOL SUCCINATE ER 50 MG PO TB24: ORAL_TABLET | ORAL | 3 refills | Status: DC

## 2021-01-31 NOTE — Telephone Encounter (Signed)
Fax request from [facility]  pharmacy for toprol xl 50 mg qd, #[phone] RF:3 M.Lenze, PA-C

## 2021-02-11 ENCOUNTER — Encounter: Payer: Self-pay | Admitting: Adult Health

## 2021-02-11 ENCOUNTER — Other Ambulatory Visit: Payer: Self-pay

## 2021-02-11 ENCOUNTER — Ambulatory Visit (INDEPENDENT_AMBULATORY_CARE_PROVIDER_SITE_OTHER): Payer: No Typology Code available for payment source | Admitting: Adult Health

## 2021-02-11 VITALS — BP 127/90 | HR 74 | Ht 64.5 in | Wt 213.0 lb

## 2021-02-11 DIAGNOSIS — Z6836 Body mass index (BMI) 36.0-36.9, adult: Secondary | ICD-10-CM

## 2021-02-11 DIAGNOSIS — Z713 Dietary counseling and surveillance: Secondary | ICD-10-CM | POA: Diagnosis not present

## 2021-02-11 NOTE — Progress Notes (Signed)
  Subjective:     Patient ID: Cheryl Ballard, female   DOB: 11-19-1975, 45 y.o.   MRN: [id number]  HPI [name] is a 45 year year old white female,married, G2P0020, back in follow up on starting  Saxenda and has lost 10 lbs. PCP is Jonetta Osgood NP Lab Results  Component Value Date   DIAGPAP  06/27/2020    - Negative for intraepithelial lesion or malignancy (NILM)   HPV NOT DETECTED 04/09/2016   S[place]el Negative 06/27/2020    Review of Systems +weight loss Has had some nausea Some decrease in BMs Reviewed past medical,surgical, social and family history. Reviewed medications and allergies.     Objective:   Physical Exam BP 127/90 (BP Location: Right Arm, Patient Position: Sitting, Cuff Size: Large)   Pulse 74   Ht 5' 4.5" (1.638 m)   Wt 213 lb (96.6 kg)   LMP  (LMP Unknown)   BMI 36.00 kg/m     Skin warm and dry.  Lungs: clear to ausculation bilaterally. Cardiovascular: regular rate and rhythm.  Lost 10 lbs in 1 month    [facility] - 02/11/21 1121       Pregnancy Intention Screening   Does the patient want to become pregnant in the next year? No    Does the patient's partner want to become pregnant in the next year? No    Would the patient like to discuss contraceptive options today? No      Contraception Wrap Up   Current Method No Method - Other Reason    End Method No Method - Other Reason    Contraception Counseling Provided No             Assessment:     1. Body mass index 36.0-36.9, adult   2. Weight loss counseling, encounter for Continue Saxenda is on 3 mg, has refills     Plan:   Follow up in 2 months

## 2021-02-21 ENCOUNTER — Other Ambulatory Visit (HOSPITAL_COMMUNITY): Payer: Self-pay

## 2021-02-22 ENCOUNTER — Other Ambulatory Visit (HOSPITAL_COMMUNITY): Payer: Self-pay

## 2021-03-18 NOTE — Progress Notes (Deleted)
Cardiology Office Note    Date:  03/18/2021   ID:  Cheryl Ballard, DOB Jul 04, 1975, MRN [id number]   PCP:  Noreene Larsson, NP   [facility]  Cardiologist:  Shelva Majestic, MD *** Advanced Practice Provider:  No care team member to display Electrophysiologist:  None   [phone]   No chief complaint on file.   History of Present Illness:  Cheryl Ballard is a 46 y.o. 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   I saw the patient 12/24/20 and had gained some weight with some elevated BP's and had plans to start saxenda.     Past Medical History:  Diagnosis Date   Anxiety    cycles around   Back pain    Depression    Diverticulitis    Hypertension    Irregular menses 11/22/2014   S/P colostomy [facility])    had colostomy with surgery, but has been reversed   Sciatica 11/22/2014    Past Surgical History:  Procedure Laterality Date   BOWEL RESECTION     COLON SURGERY  04/2010   COLONOSCOPY WITH PROPOFOL N/A 11/14/2020   Procedure: COLONOSCOPY WITH PROPOFOL;  Surgeon: Rogene Houston, MD;  [facility];  Service: Endoscopy;  Laterality: N/A;  7:30   Colostomy in April     reversal in April 2013   POLYPECTOMY  11/14/2020   Procedure: POLYPECTOMY;  Surgeon: Rogene Houston, MD;  [facility];  Service: Endoscopy;;  hepatic flexure    Current Medications: No outpatient medications have been marked as taking for the 04/01/21 encounter (Appointment) with Imogene Burn, PA-C.     Allergies:   Patient has no known allergies.   Social History   Socioeconomic History   Marital status: Married    Spouse name: Cheryl Ballard    Number of children: 0   Years of education: 16   Highest education level: Bachelor's degree (e.g., BA, AB, BS)   Occupational History   Occupation: Marine scientist in Chief Operating Officer: [employer]    Comment: 23 years   Tobacco Use   Smoking status: Some Days    Packs/day: 0.00    Years: 23.00    Pack years: 0.00    Types: Cigarettes   Smokeless tobacco: Never   Tobacco comments:    Patient states that she is trying to quit.  Vaping Use   Vaping Use: Never used  Substance and Sexual Activity   Alcohol use: Yes    Alcohol/week: 1.0 standard drink    Types: 1 Cans of beer per week    Comment: occasional   Drug use: No   Sexual activity: Yes    Birth control/protection: None  Other Topics Concern   Not on file  Social History Narrative   Lives with Cheryl Ballard, husband of 4 years in May of 2020.   Great Dane named, Diesel who is 48 years old.      Main caregiver for mom-who has parkinson's and DDD, she is 90 years old (2020).      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       Wears seat belt.   Wears sunscreen.   Eats fruits and veggies.   Consumes  milk regularly.     Social Determinants of Health   Financial Resource Strain: Low Risk    Difficulty of Paying Living Expenses: Not hard at all  Food Insecurity: No Food Insecurity   Worried About Charity fundraiser in the Last Year: Never true   [place] in the Last Year: Never true  Transportation Needs: No Transportation Needs   Lack of Transportation (Medical): No   Lack of Transportation (Non-Medical): No  Physical Activity: Unknown   Days of Exercise per Week: 3 days   Minutes of Exercise per Session: Not on file  Stress: No Stress Concern Present   Feeling of Stress : Only a little  Social Connections: Moderately Integrated   Frequency of Communication with Friends and Family: More than three times a week   Frequency of Social Gatherings with Friends and Family: More than three times a week   Attends Religious Services: More than 4 times per year   Active Member of [employer] or Organizations: No   Attends Arts development officer: Never   Marital Status: Married     Family History:  The patient's ***family history includes Arthritis in her mother; Early death in her brother; Heart disease in her brother, maternal grandmother, and mother; Parkinson's disease in her mother.   ROS:   Please see the history of present illness.    ROS All other systems reviewed and are negative.   PHYSICAL EXAM:   VS:  There were no vitals taken for this visit.  Physical Exam  GEN: Well nourished, well developed, in no acute distress  HEENT: normal  Neck: no JVD, carotid bruits, or masses Cardiac:RRR; no murmurs, rubs, or gallops  Respiratory:  clear to auscultation bilaterally, normal work of breathing GI: soft, nontender, nondistended, + BS Ext: without cyanosis, clubbing, or edema, Good distal pulses bilaterally MS: no deformity or atrophy  Skin: warm and dry, no rash Neuro:  Alert and Oriented x 3, Strength and sensation are intact Psych: euthymic mood, full affect  Wt Readings from Last 3 Encounters:  02/11/21 213 lb (96.6 kg)  01/08/21 223 lb (101.2 kg)  12/24/20 224 lb (101.6 kg)      Studies/Labs Reviewed:   EKG:  EKG is*** ordered today.  The ekg ordered today demonstrates ***  Recent Labs: 06/27/2020: ALT 21; BUN 16; Creatinine, Ser 0.93; Potassium 4.7; Sodium 140; TSH 2.570 11/09/2020: Hemoglobin 14.6; Platelets 257   Lipid Panel    Component Value Date/Time   CHOL 179 06/27/2020 0931   TRIG 111 06/27/2020 0931   HDL 49 06/27/2020 0931   CHOLHDL 3.7 06/27/2020 0931   CHOLHDL 2 05/13/2012 1251   VLDL 11.8 05/13/2012 1251   LDLCALC 110 (H) 06/27/2020 0931    Additional studies/ records that were reviewed today include:   Echocardiogram 07/11/2016  Left ventricle: The cavity size was normal. Wall thickness was   normal. Systolic function was vigorous. The estimated ejection   fraction was in the range of 65% to 70%. Wall motion was normal;   there were no regional wall motion  abnormalities. Left   ventricular diastolic function parameters were normal. - Right ventricle: The cavity size was normal. Wall thickness was   normal.       Risk Assessment/Calculations:   {Does this patient have ATRIAL FIBRILLATION?:[id number]}     ASSESSMENT:    No diagnosis found.   PLAN:  In order of problems listed above:  HTN-BP running high recently. She's gained 40  lbs and is going to start [place] after vacation. Recommend 2 gm sodium diet, 150 min exercise, weight loss. She'll keep track of BP and Pulse. Can increase metoprolol to 75 mg if it continues to run high.   Palpitations doing well on metoprolol   Anxiety depression managed by PCP    Shared Decision Making/Informed Consent   {Are you ordering a CV Procedure (e.g. stress test, cath, DCCV, TEE, etc)?   Press F2        :[id number]}    Medication Adjustments/Labs and Tests Ordered: Current medicines are reviewed at length with the patient today.  Concerns regarding medicines are outlined above.  Medication changes, Labs and Tests ordered today are listed in the Patient Instructions below. There are no Patient Instructions on file for this visit.   Sumner Boast, PA-C  03/18/2021 12:15 PM    [facility] [place], [place], [place]  [address] Phone: [phone]; Fax: [phone]

## 2021-03-28 ENCOUNTER — Other Ambulatory Visit (HOSPITAL_COMMUNITY): Payer: Self-pay

## 2021-04-01 ENCOUNTER — Ambulatory Visit: Payer: No Typology Code available for payment source | Admitting: Physician Assistant

## 2021-04-16 ENCOUNTER — Encounter: Payer: Self-pay | Admitting: Adult Health

## 2021-04-16 ENCOUNTER — Other Ambulatory Visit: Payer: Self-pay

## 2021-04-16 ENCOUNTER — Ambulatory Visit (INDEPENDENT_AMBULATORY_CARE_PROVIDER_SITE_OTHER): Payer: No Typology Code available for payment source | Admitting: Adult Health

## 2021-04-16 VITALS — BP 123/90 | HR 78 | Ht 65.0 in | Wt 196.0 lb

## 2021-04-16 DIAGNOSIS — Z713 Dietary counseling and surveillance: Secondary | ICD-10-CM

## 2021-04-16 DIAGNOSIS — Z6832 Body mass index (BMI) 32.0-32.9, adult: Secondary | ICD-10-CM | POA: Diagnosis not present

## 2021-04-16 NOTE — Progress Notes (Signed)
°  Subjective:     Patient ID: Cheryl Ballard, female   DOB: 05/25/1975, 46 y.o.   MRN: [id number]  HPI Cheryl Ballard is a 46 year old white female,married, G2P0020, in for weight check and ROS on saxenda since November and has lost 27 lbs total. Lab Results  Component Value Date   DIAGPAP  06/27/2020    - Negative for intraepithelial lesion or malignancy (NILM)   HPV NOT DETECTED 04/09/2016   [place] Negative 06/27/2020    PCP is Dr Posey Pronto.  Review of Systems Is losing weight Has some nausea at times Reviewed past medical,surgical, social and family history. Reviewed medications and allergies.     Objective:   Physical Exam BP 123/90 (BP Location: Right Arm, Patient Position: Sitting, Cuff Size: Normal)    Pulse 78    Ht 5\' 5"  (1.651 m)    Wt 196 lb (88.9 kg)    LMP 11/15/2020 (Approximate)    BMI 32.62 kg/m     Skin warm and dry. Lungs: clear to ausculation bilaterally. Cardiovascular: regular rate and rhythm.   Upstream - 04/16/21 1020       Pregnancy Intention Screening   Does the patient want to become pregnant in the next year? No    Does the patient's partner want to become pregnant in the next year? No    Would the patient like to discuss contraceptive options today? No      Contraception Wrap Up   Current Method No Method - Other Reason    End Method No Method - Other Reason    Contraception Counseling Provided No             Assessment:     1. Weight loss counseling, encounter for Continue weight loss efforts Has refills on saxenda She is on 3 mg now  2. Body mass index 32.0-32.9, adult     Plan:     Follow up in 12 weeks

## 2021-05-01 ENCOUNTER — Other Ambulatory Visit: Payer: Self-pay

## 2021-05-01 ENCOUNTER — Other Ambulatory Visit (HOSPITAL_COMMUNITY): Payer: Self-pay

## 2021-05-01 ENCOUNTER — Ambulatory Visit: Payer: No Typology Code available for payment source | Admitting: Nurse Practitioner

## 2021-05-01 MED ORDER — METOPROLOL SUCCINATE ER 50 MG PO TB24: 50.0000 mg | ORAL_TABLET | Freq: Every day | ORAL | 3 refills | Status: DC

## 2021-05-01 MED ORDER — METOPROLOL SUCCINATE ER 50 MG PO TB24: ORAL_TABLET | ORAL | 3 refills | Status: DC

## 2021-05-28 NOTE — Progress Notes (Signed)
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    I saw the patient 12/24/20 and going to start Saxenda for weight loss.BP was running high and she was going to try to lose weight, low sodium, possibly increase metoprolol 75 mg daily.  Patient comes in for f/u. She has lost 30 lbs. BP still running high in 90's diastolic when at work. Watches her salt, no regular exercise as she's caring for her mom with Parkinson's.    Past Medical History:  Diagnosis Date   Anxiety    cycles around   Back pain    Depression    Diverticulitis    Hypertension    Irregular menses 11/22/2014   S/P colostomy [facility])    had colostomy with surgery, but has been reversed   Sciatica 11/22/2014    Past Surgical History:  Procedure Laterality Date   BOWEL RESECTION     COLON SURGERY  04/2010   COLONOSCOPY WITH PROPOFOL N/A 11/14/2020   Procedure: COLONOSCOPY WITH PROPOFOL;  Surgeon: Malissa Hippo, MD;  [facility];  Service: Endoscopy;  Laterality: N/A;  7:30   Colostomy in April     reversal in April 2013   POLYPECTOMY  11/14/2020   Procedure: POLYPECTOMY;  Surgeon: Malissa Hippo, MD;  [facility];  Service: Endoscopy;;  hepatic flexure    Current Medications: Current Meds  Medication Sig   escitalopram (LEXAPRO) 20 MG tablet TAKE ONE (1) TABLET BY MOUTH  EVERY DAY   Insulin Pen Needle 31G X 5 MM MISC Use daily with Saxenda   Liraglutide -Weight Management (SAXENDA) 18 MG/3ML SOPN Inject 0.6 mg daily for 1 week, then , increase the dose by 0.6mg  each week until a max dose of 3mg  daily is reached on week 5   metoprolol succinate (TOPROL-XL) 50 MG 24 hr tablet Take 1.5 tablets (75 mg total) by mouth daily. Take with or immediately following a meal.   [DISCONTINUED] metoprolol succinate (TOPROL XL) 50 MG 24 hr tablet Take 1 tablet (50 mg total) by mouth daily. Take with or immediately following a meal.     Allergies:   Patient has no known allergies.   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  Vaping Use   Vaping Use: Never used  Substance and Sexual Activity   Alcohol use: Yes    Alcohol/week: 1.0 standard drink    Types: 1 Cans of beer per week    Comment: occasional   Drug use: No   Sexual activity: Yes    Birth control/protection: None  Other Topics Concern   Not on file  Social History Narrative   Lives with Chrissie Noa, husband of 4 years in May of 2020.   Great Dane named, Diesel who is 48 years old.      Main caregiver for mom-who has parkinson's and DDD, she is 35 years old (2020).      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       Wears seat belt.   Wears sunscreen.   Eats fruits and veggies.   Consumes milk regularly.     Social Determinants of Health   Financial Resource Strain: Low Risk    Difficulty of Paying Living Expenses:  Not hard at all  Food Insecurity: No Food Insecurity   Worried About Programme researcher, broadcasting/film/video in the Last Year: Never true   Ran Out of Food in the Last Year: Never true  Transportation Needs: No Transportation Needs   Lack of Transportation (Medical): No   Lack of Transportation (Non-Medical): No  Physical Activity: Unknown   Days of Exercise per Week: 3 days   Minutes of Exercise per Session: Not on file  Stress: No Stress Concern Present   Feeling of Stress : Only a little  Social Connections: Moderately Integrated   Frequency of Communication with Friends and Family: More than three times a week   Frequency of Social Gatherings with Friends and Family: More than three times a week   Attends Religious Services: More than 4 times per year   Active Member of [employer] or Organizations: No   Attends Engineer, structural: Never   Marital Status: Married     Family History:  The patient's  family history includes Arthritis in her mother; Early death in her brother; Heart disease in her brother, maternal grandmother, and mother; Parkinson's disease in her mother.   ROS:   Please see the history of present illness.    ROS All other systems reviewed and are negative.   PHYSICAL EXAM:   VS:  BP 130/80   Pulse 66   Ht 5\' 5"  (1.651 m)   Wt 193 lb (87.5 kg)   SpO2 98%   BMI 32.12 kg/m   Physical Exam  GEN: Well nourished, well developed, in no acute distress  Neck: no JVD, carotid bruits, or masses Cardiac:RRR; no murmurs, rubs, or gallops  Respiratory:  clear to auscultation bilaterally, normal work of breathing GI: soft, nontender, nondistended, + BS Ext: without cyanosis, clubbing, or edema, Good distal pulses bilaterally Neuro:  Alert and Oriented x 3 Psych: euthymic mood, full affect  Wt Readings from Last 3 Encounters:  06/03/21 193 lb (87.5 kg)  04/16/21 196 lb (88.9 kg)  02/11/21 213 lb (96.6 kg)      Studies/Labs Reviewed:   EKG:  EKG is not ordered today.      Recent Labs: 06/27/2020: ALT 21; BUN 16; Creatinine, Ser 0.93; Potassium 4.7; Sodium 140; TSH 2.570 11/09/2020: Hemoglobin 14.6; Platelets 257   Lipid Panel    Component Value Date/Time   CHOL 179 06/27/2020 0931   TRIG 111 06/27/2020 0931  HDL 49 06/27/2020 0931   CHOLHDL 3.7 06/27/2020 0931   CHOLHDL 2 05/13/2012 1251   VLDL 11.8 05/13/2012 1251   LDLCALC 110 (H) 06/27/2020 0931    Additional studies/ records that were reviewed today include:  Echocardiogram 07/11/2016  Left ventricle: The cavity size was normal. Wall thickness was   normal. Systolic function was vigorous. The estimated ejection   fraction was in the range of 65% to 70%. Wall motion was normal;   there were no regional wall motion abnormalities. Left   ventricular diastolic function parameters were normal. - Right ventricle: The cavity size was normal. Wall thickness was   normal.     Risk Assessment/Calculations:         ASSESSMENT:    1. Essential hypertension   2. Palpitations   3. Situational anxiety      PLAN:  In order of problems listed above:  HTN-BP still running high at work despite 30 lbs weight loss on Saxenda. Will  increase metoprolol to 75 mg and if it continues to run high after 2 weeks increase 100 mg daily. She'll send me a log of BP and P in 2 weeks. 150 min exercise.   Palpitations doing well on metoprolol   Anxiety depression managed by PCP    Shared Decision Making/Informed Consent        Medication Adjustments/Labs and Tests Ordered: Current medicines are reviewed at length with the patient today.  Concerns regarding medicines are outlined above.  Medication changes, Labs and Tests ordered today are listed in the Patient Instructions below. Patient Instructions  Medication Instructions:  Your physician has recommended you make the following change in your medication:    Increase Toprol XL to 75 mg Daily   *If you need a refill on your cardiac medications before  your next appointment, please call your pharmacy*   Lab Work: NONE   If you have labs (blood work) drawn today and your tests are completely normal, you will receive your results only by: MyChart Message (if you have MyChart) OR A paper copy in the mail If you have any lab test that is abnormal or we need to change your treatment, we will call you to review the results.   Testing/Procedures: NONE     Follow-Up: At [facility], you and your health needs are our priority.  As part of our continuing mission to provide you with exceptional heart care, we have created designated Provider Care Teams.  These Care Teams include your primary Cardiologist (physician) and Advanced Practice Providers (APPs -  Physician Assistants and Nurse Practitioners) who all work together to provide you with the care you need, when you need it.  We recommend signing up for the patient portal called "MyChart".  Sign up information is provided on this After Visit Summary.  MyChart is used to connect with patients for Virtual Visits (Telemedicine).  Patients are able to view lab/test results, encounter notes, upcoming appointments, etc.  Non-urgent messages can be sent to your provider as well.   To learn more about what you can do with MyChart, go to [email or web address].    Your next appointment:   1 year(s)  The format for your next appointment:   In Person  Provider:   Dr. Tresa Endo      Other Instructions Please record Blood Pressure Readings for 2 weeks and send by MyChart  Your provider would like you to have 150 minutes of exercise weekly   Thank you for choosing [facility]!      Elson Clan, PA-C  06/03/2021 11:20 AM    [facility] [address] [place], [place], [place]  [address] Phone: [phone]; Fax: [phone]

## 2021-06-03 ENCOUNTER — Encounter: Payer: Self-pay | Admitting: Physician Assistant

## 2021-06-03 ENCOUNTER — Ambulatory Visit: Payer: No Typology Code available for payment source | Admitting: Physician Assistant

## 2021-06-03 VITALS — BP 130/80 | HR 66 | Ht 65.0 in | Wt 193.0 lb

## 2021-06-03 DIAGNOSIS — I10 Essential (primary) hypertension: Secondary | ICD-10-CM | POA: Diagnosis not present

## 2021-06-03 DIAGNOSIS — F418 Other specified anxiety disorders: Secondary | ICD-10-CM | POA: Diagnosis not present

## 2021-06-03 DIAGNOSIS — R002 Palpitations: Secondary | ICD-10-CM

## 2021-06-03 MED ORDER — METOPROLOL SUCCINATE ER 50 MG PO TB24: 75.0000 mg | ORAL_TABLET | Freq: Every day | ORAL | 3 refills | Status: DC

## 2021-06-03 NOTE — Patient Instructions (Signed)
Medication Instructions:  ?Your physician has recommended you make the following change in your medication:   ? ?Increase Toprol XL to 75 mg Daily  ? ?*If you need a refill on your cardiac medications before your next appointment, please call your pharmacy* ? ? ?Lab Work: ?NONE  ? ?If you have labs (blood work) drawn today and your tests are completely normal, you will receive your results only by: ?MyChart Message (if you have MyChart) OR ?A paper copy in the mail ?If you have any lab test that is abnormal or we need to change your treatment, we will call you to review the results. ? ? ?Testing/Procedures: ?NONE  ? ? ? ?Follow-Up: ?At [facility], you and your health needs are our priority.  As part of our continuing mission to provide you with exceptional heart care, we have created designated Provider Care Teams.  These Care Teams include your primary Cardiologist (physician) and Advanced Practice Providers (APPs -  Physician Assistants and Nurse Practitioners) who all work together to provide you with the care you need, when you need it. ? ?We recommend signing up for the patient portal called "MyChart".  Sign up information is provided on this After Visit Summary.  MyChart is used to connect with patients for Virtual Visits (Telemedicine).  Patients are able to view lab/test results, encounter notes, upcoming appointments, etc.  Non-urgent messages can be sent to your provider as well.   ?To learn more about what you can do with MyChart, go to [email or web address].   ? ?Your next appointment:   ?1 year(s) ? ?The format for your next appointment:   ?In Person ? ?Provider:   ?Dr. Claiborne Billings    ? ? ?Other Instructions ?Please record Blood Pressure Readings for 2 weeks and send by MyChart  ?Your provider would like you to have 150 minutes of exercise weekly  ? ?Thank you for choosing [facility]! ? ? ? ?

## 2021-06-13 ENCOUNTER — Other Ambulatory Visit (HOSPITAL_COMMUNITY): Payer: Self-pay

## 2021-06-14 ENCOUNTER — Other Ambulatory Visit (HOSPITAL_COMMUNITY): Payer: Self-pay

## 2021-07-02 ENCOUNTER — Ambulatory Visit: Payer: No Typology Code available for payment source | Admitting: Nurse Practitioner

## 2021-07-09 ENCOUNTER — Ambulatory Visit (INDEPENDENT_AMBULATORY_CARE_PROVIDER_SITE_OTHER): Payer: No Typology Code available for payment source | Admitting: Family Medicine

## 2021-07-09 ENCOUNTER — Encounter: Payer: Self-pay | Admitting: Family Medicine

## 2021-07-09 ENCOUNTER — Ambulatory Visit: Payer: No Typology Code available for payment source | Admitting: Adult Health

## 2021-07-09 DIAGNOSIS — F418 Other specified anxiety disorders: Secondary | ICD-10-CM

## 2021-07-09 MED ORDER — ESCITALOPRAM OXALATE 20 MG PO TABS: ORAL_TABLET | ORAL | 3 refills | Status: DC

## 2021-07-09 NOTE — Assessment & Plan Note (Signed)
Refill lexapro.  

## 2021-07-09 NOTE — Assessment & Plan Note (Signed)
-  Reports symptom management with Lexapro ?-Refilled med ?

## 2021-07-09 NOTE — Patient Instructions (Signed)
I appreciate the opportunity to provide care to you today! ? ?  ?Refills:Lexapro ?  ?Please continue to a heart-healthy diet and increase your physical activities. Try to exercise for 26mns at least three times a week.  ? ? ?  ?It was a pleasure to see you and I look forward to continuing to work together on your health and well-being. ?Please do not hesitate to call the office if you need care or have questions about your care. ?  ?Have a wonderful day and week. ?With Gratitude, ?GAlvira MondayMSN, FNP-BC  ?

## 2021-07-09 NOTE — Progress Notes (Addendum)
? ?Established Patient Office Visit ? ?Subjective:  ?Patient ID: Cheryl Ballard, female    DOB: June 04, 1975  Age: 46 y.o. MRN: [id number] ? ?CC:  ?Chief Complaint  ?Patient presents with  ? Medication Refill  ?  Needs refills. No complaints.   ? ? ?HPI ?Anie Juniel is a 46 y.o. female with past medical history of depression presents for medications refill. She reports no concerns or complaints.   ? ? ?Past Medical History:  ?Diagnosis Date  ? Anxiety   ? cycles around  ? Back pain   ? Depression   ? Diverticulitis   ? Hypertension   ? Irregular menses 11/22/2014  ? S/P colostomy ([place])   ? had colostomy with surgery, but has been reversed  ? Sciatica 11/22/2014  ? ? ?Past Surgical History:  ?Procedure Laterality Date  ? BOWEL RESECTION    ? COLON SURGERY  04/2010  ? COLONOSCOPY WITH PROPOFOL N/A 11/14/2020  ? Procedure: COLONOSCOPY WITH PROPOFOL;  Surgeon: Rogene Houston, MD;  [facility];  Service: Endoscopy;  Laterality: N/A;  7:30  ? Colostomy in April    ? reversal in April 2013  ? POLYPECTOMY  11/14/2020  ? Procedure: POLYPECTOMY;  Surgeon: Rogene Houston, MD;  Location: [facility];  Service: Endoscopy;;  hepatic flexure  ? ? ?Family History  ?Problem Relation Age of Onset  ? Heart disease Mother   ?     afib  ? Parkinson's disease Mother   ? Arthritis Mother   ? Early death Brother   ? Heart disease Brother   ? Heart disease Maternal Grandmother   ?     MI-CAD  ? ? ?Social History  ? ?Socioeconomic History  ? Marital status: Married  ?  Spouse name: Gwyndolyn Saxon   ? Number of children: 0  ? Years of education: 90  ? Highest education level: Bachelor's degree (e.g., BA, AB, BS)  ?Occupational History  ? 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  Employer: [employer]  ?  Comment: 23 years   ?Tobacco Use  ? Smoking status: Some Days  ?  Packs/day: 0.00  ?  Years: 23.00  ?  Pack years: 0.00  ?  Types: Cigarettes  ? Smokeless tobacco: Never  ? Tobacco comments:  ?  Patient states that she is trying to  quit.  ?Vaping Use  ? Vaping Use: Never used  ?Substance and Sexual Activity  ? Alcohol use: Yes  ?  Alcohol/week: 1.0 standard drink  ?  Types: 1 Cans of beer per week  ?  Comment: occasional  ? Drug use: No  ? Sexual activity: Yes  ?  Birth control/protection: None  ?Other Topics Concern  ? Not on file  ?Social History Narrative  ? Lives with Gwyndolyn Saxon, husband of 4 years in May of 2020.  ? Great Dane named, Diesel who is 48 years old.  ?   ? Main caregiver for mom-who has parkinson's and DDD, she is 70 years old (2020).  ?   ? 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   ?   ? Wears seat belt.  ? Wears sunscreen.  ? Eats fruits and veggies.  ? Consumes milk regularly.    ? ?Social Determinants of Health  ? ?Financial Resource Strain: Not on file  ?Food Insecurity: Not on file  ?Transportation Needs: Not on file  ?Physical Activity: Not on file  ?Stress: Not on file  ?Social Connections: Not on  file  ?Intimate Partner Violence: Not on file  ? ? ?Outpatient Medications Prior to Visit  ?Medication Sig Dispense Refill  ? Insulin Pen Needle 31G X 5 MM MISC Use daily with Saxenda 100 each 2  ? Liraglutide -Weight Management (SAXENDA) 18 MG/3ML SOPN Inject 0.6 mg daily for 1 week, then , increase the dose by 0.61m each week until a max dose of 337mdaily is reached on week 5 15 mL 5  ? metoprolol succinate (TOPROL-XL) 50 MG 24 hr tablet Take 1.5 tablets (75 mg total) by mouth daily. Take with or immediately following a meal. 135 tablet 3  ? escitalopram (LEXAPRO) 20 MG tablet TAKE ONE (1) TABLET BY MOUTH EVERY DAY 90 tablet 3  ? ?No facility-administered medications prior to visit.  ? ? ?No Known Allergies ? ?ROS ?Review of Systems  ?Constitutional:  Negative for chills, diaphoresis, fatigue and fever.  ?HENT:  Negative for rhinorrhea, sinus pressure, sinus pain and sore throat.   ?Eyes:  Negative for pain, redness and itching.  ?Respiratory:  Negative for chest tightness, shortness of breath and wheezing.    ?Cardiovascular:  Negative for chest pain and palpitations.  ?Gastrointestinal:  Positive for constipation. Negative for diarrhea, nausea and vomiting.  ?Endocrine: Negative for polydipsia, polyphagia and polyuria.  ?Genitourinary:  Negative for frequency and urgency.  ?Musculoskeletal:  Negative for back pain and gait problem.  ?Skin:  Negative for rash and wound.  ?Neurological:  Negative for dizziness, tremors, weakness and headaches.  ?Hematological:  Does not bruise/bleed easily.  ?Psychiatric/Behavioral:  Negative for confusion, sleep disturbance and suicidal ideas.   ? ?  ?Objective:  ?  ?Physical Exam ?Constitutional:   ?   Appearance: Normal appearance.  ?HENT:  ?   Head: Normocephalic.  ?   Nose: No congestion or rhinorrhea.  ?   Mouth/Throat:  ?   Mouth: Mucous membranes are moist.  ?Cardiovascular:  ?   Rate and Rhythm: Normal rate and regular rhythm.  ?   Pulses: Normal pulses.  ?   Heart sounds: Normal heart sounds.  ?Pulmonary:  ?   Effort: Pulmonary effort is normal.  ?   Breath sounds: Normal breath sounds.  ?Skin: ?   General: Skin is warm.  ?   Coloration: Skin is not jaundiced.  ?   Findings: No bruising.  ?Psychiatric:  ?   Comments: Normal affect  ? ? ?BP 116/72   Pulse 68   Ht _0  (1.651 m)   Wt 196 lb (88.9 kg)   SpO2 98%   BMI 32.62 kg/m?  ?Wt Readings from Last 3 Encounters:  ?07/09/21 196 lb (88.9 kg)  ?06/03/21 193 lb (87.5 kg)  ?04/16/21 196 lb (88.9 kg)  ? ? ?Lab Results  ?Component Value Date  ? TSH 2.570 06/27/2020  ? ?Lab Results  ?Component Value Date  ? WBC 9.5 11/09/2020  ? HGB 14.6 11/09/2020  ? HCT 44.1 11/09/2020  ? MCV 95.0 11/09/2020  ? PLT 257 11/09/2020  ? ?Lab Results  ?Component Value Date  ? NA 140 06/27/2020  ? K 4.7 06/27/2020  ? CO2 21 06/27/2020  ? GLUCOSE 101 (H) 06/27/2020  ? BUN 16 06/27/2020  ? CREATININE 0.93 06/27/2020  ? BILITOT 0.3 06/27/2020  ? ALKPHOS 81 06/27/2020  ? AST 18 06/27/2020  ? ALT 21 06/27/2020  ? PROT 6.8 06/27/2020  ? ALBUMIN 4.5  06/27/2020  ? CALCIUM 9.6 06/27/2020  ? EGFR 76 06/27/2020  ? ?Lab Results  ?Component Value  Date  ? CHOL 179 06/27/2020  ? ?Lab Results  ?Component Value Date  ? HDL 49 06/27/2020  ? ?Lab Results  ?Component Value Date  ? LDLCALC 110 (H) 06/27/2020  ? ?Lab Results  ?Component Value Date  ? TRIG 111 06/27/2020  ? ?Lab Results  ?Component Value Date  ? CHOLHDL 3.7 06/27/2020  ? ?Lab Results  ?Component Value Date  ? HGBA1C 5.5 06/27/2020  ? ? ?  ?Assessment & Plan:  ? ?Problem List Items Addressed This Visit   ? ?  ? Other  ? Situational anxiety  ?  -Reports symptom management with Lexapro ?-Refilled med ? ?  ?  ? Relevant Medications  ? escitalopram (LEXAPRO) 20 MG tablet  ? ? ?Meds ordered this encounter  ?Medications  ? escitalopram (LEXAPRO) 20 MG tablet  ?  Sig: TAKE ONE (1) TABLET BY MOUTH EVERY DAY  ?  Dispense:  90 tablet  ?  Refill:  3  ? ? ?Follow-up: No follow-ups on file.  ? ? ?Alvira Monday, FNP ?

## 2021-07-18 ENCOUNTER — Other Ambulatory Visit (HOSPITAL_COMMUNITY): Payer: Self-pay

## 2021-08-21 ENCOUNTER — Other Ambulatory Visit: Payer: Self-pay | Admitting: Adult Health

## 2021-08-22 ENCOUNTER — Other Ambulatory Visit (HOSPITAL_COMMUNITY): Payer: Self-pay

## 2021-08-22 MED ORDER — SAXENDA 18 MG/3ML ~~LOC~~ SOPN
3.0000 mg | PEN_INJECTOR | Freq: Every day | SUBCUTANEOUS | 5 refills | Status: DC
  Filled 2021-08-22: qty 15, 30d supply, fill #0
  Filled 2021-10-11: qty 15, 30d supply, fill #1

## 2021-08-30 ENCOUNTER — Ambulatory Visit (INDEPENDENT_AMBULATORY_CARE_PROVIDER_SITE_OTHER): Payer: No Typology Code available for payment source | Admitting: Family Medicine

## 2021-08-30 ENCOUNTER — Encounter: Payer: Self-pay | Admitting: Family Medicine

## 2021-08-30 DIAGNOSIS — Z713 Dietary counseling and surveillance: Secondary | ICD-10-CM | POA: Diagnosis not present

## 2021-08-30 NOTE — Assessment & Plan Note (Signed)
-  Patient is informed that [place] and Wvogy are both  GLP-1 inhibitors and work the same by decreasing appetite to support wt loss -Patient opted to stay on Sexanda

## 2021-08-30 NOTE — Patient Instructions (Addendum)
I appreciate the opportunity to provide care to you today!   -We will see you back in August for your Physical Exam   Please continue to a heart-healthy diet and increase your physical activities. Try to exercise for 27mns at least three times a week.      It was a pleasure to see you and I look forward to continuing to work together on your health and well-being. Please do not hesitate to call the office if you need care or have questions about your care.   Have a wonderful day and week. With Gratitude, GAlvira MondayMSN, FNP-BC

## 2021-08-30 NOTE — Progress Notes (Signed)
   Acute Office Visit  Subjective:     Patient ID: Khamari Yousuf, female    DOB: 27-Jun-1975, 46 y.o.   MRN: [id number]  Chief Complaint  Patient presents with   Weight Check    Would like to discuss weight loss, saxenda is not working anymore.     HPI The patient is in today and wants to discuss weight loss options. She is currently taking Saxenda and will like to switch to Wvogy.  Review of Systems  Constitutional:  Negative for chills and fever.  Gastrointestinal:  Negative for nausea and vomiting.  Psychiatric/Behavioral:  Negative for substance abuse.         Objective:    BP (!) 144/93   Pulse 69   Ht '5\' 5"'$  (1.651 m)   Wt 200 lb 12.8 oz (91.1 kg)   SpO2 97%   BMI 33.41 kg/m    Physical Exam HENT:     Head: Normocephalic.  Cardiovascular:     Rate and Rhythm: Normal rate and regular rhythm.     Pulses: Normal pulses.     Heart sounds: Normal heart sounds.  Pulmonary:     Effort: Pulmonary effort is normal.     Breath sounds: Normal breath sounds.  Neurological:     Mental Status: She is alert.     No results found for any visits on 08/30/21.      Assessment & Plan:   Problem List Items Addressed This Visit       Other   Weight loss counseling, encounter for    -Patient is informed that Sexenda and Wvogy are both  GLP-1 inhibitors and work the same by decreasing appetite to support wt loss -Patient opted to stay on Sexanda        No orders of the defined types were placed in this encounter.   No follow-ups on file.  Alvira Monday, FNP

## 2021-09-12 ENCOUNTER — Other Ambulatory Visit: Payer: Self-pay

## 2021-09-12 MED ORDER — METOPROLOL SUCCINATE ER 50 MG PO TB24: 75.0000 mg | ORAL_TABLET | Freq: Every day | ORAL | 3 refills | Status: DC

## 2021-10-11 ENCOUNTER — Other Ambulatory Visit (HOSPITAL_COMMUNITY): Payer: Self-pay

## 2021-10-11 ENCOUNTER — Telehealth: Payer: Self-pay | Admitting: Family Medicine

## 2021-10-11 NOTE — Telephone Encounter (Signed)
I tried to call patient and her voice mail is full.  Could no leave a message.  Will try again.  - Patient wants to change providers.

## 2021-10-16 ENCOUNTER — Other Ambulatory Visit (HOSPITAL_COMMUNITY): Payer: Self-pay

## 2021-10-17 ENCOUNTER — Other Ambulatory Visit (HOSPITAL_COMMUNITY): Payer: Self-pay

## 2021-10-17 MED ORDER — WEGOVY 0.25 MG/0.5ML ~~LOC~~ SOAJ
0.2500 mg | SUBCUTANEOUS | 0 refills | Status: DC
  Filled 2021-10-17: qty 2, 28d supply, fill #0

## 2021-10-24 ENCOUNTER — Ambulatory Visit (INDEPENDENT_AMBULATORY_CARE_PROVIDER_SITE_OTHER): Payer: No Typology Code available for payment source | Admitting: Internal Medicine

## 2021-10-24 ENCOUNTER — Encounter: Payer: Self-pay | Admitting: Internal Medicine

## 2021-10-24 VITALS — BP 134/85 | HR 82 | Ht 65.0 in | Wt 200.8 lb

## 2021-10-24 DIAGNOSIS — R002 Palpitations: Secondary | ICD-10-CM

## 2021-10-24 DIAGNOSIS — Z0000 Encounter for general adult medical examination without abnormal findings: Secondary | ICD-10-CM | POA: Diagnosis not present

## 2021-10-24 DIAGNOSIS — Z6833 Body mass index (BMI) 33.0-33.9, adult: Secondary | ICD-10-CM | POA: Diagnosis not present

## 2021-10-24 DIAGNOSIS — Z0001 Encounter for general adult medical examination with abnormal findings: Secondary | ICD-10-CM

## 2021-10-24 DIAGNOSIS — F17209 Nicotine dependence, unspecified, with unspecified nicotine-induced disorders: Secondary | ICD-10-CM | POA: Diagnosis not present

## 2021-10-24 DIAGNOSIS — F32A Depression, unspecified: Secondary | ICD-10-CM

## 2021-10-24 DIAGNOSIS — Z131 Encounter for screening for diabetes mellitus: Secondary | ICD-10-CM

## 2021-10-24 DIAGNOSIS — R7301 Impaired fasting glucose: Secondary | ICD-10-CM

## 2021-10-24 DIAGNOSIS — F17200 Nicotine dependence, unspecified, uncomplicated: Secondary | ICD-10-CM

## 2021-10-24 NOTE — Assessment & Plan Note (Signed)
Presenting today for her annual physical exam.  She reports feeling well and denies acute concerns.  Basic lab work ordered today, including one-time screenings for HIV/HCV.  Cancer screenings are up-to-date.  She will receive her influenza vaccine next month at work.

## 2021-10-24 NOTE — Assessment & Plan Note (Signed)
Currently taking metoprolol succinate 75 mg daily.  Denies recent heart palpitations.

## 2021-10-24 NOTE — Assessment & Plan Note (Signed)
She continues to smoke cigarettes situationally, namely when she drinks alcohol on the weekends.  I again counseled her on smoking cessation.

## 2021-10-24 NOTE — Patient Instructions (Signed)
It was a pleasure to see you today.  Thank you for giving [place] the opportunity to be involved in your care.  Below is a brief recap of your visit and next steps.  We will plan to see you again in 1 year  Summary We will get basic labs today as part of your physical  Next steps Plan for follow up in 1 year.  Keep working on weight loss and smoking cessation.

## 2021-10-24 NOTE — Progress Notes (Signed)
Complete physical exam  Patient: Cheryl Ballard   DOB: Feb 14, 1976   45 y.o. Female  MRN: [id number]  Subjective:    Chief Complaint  Patient presents with   Annual Exam   Cheryl Ballard is a 46 y.o. female who presents today for a complete physical exam. She reports consuming a general diet. The patient does not participate in regular exercise at present. She generally feels fairly well. She reports sleeping fairly well. She does not have additional problems to discuss today.   Most recent fall risk assessment:    10/24/2021   10:10 AM  [place] in the past year? 0  Number falls in past yr: 0  Injury with Fall? 0  Risk for fall due to : No Fall Risks  Follow up Falls evaluation completed     Most recent depression screenings:    10/24/2021   10:10 AM 08/30/2021    9:59 AM  PHQ 2/9 Scores  PHQ - 2 Score 0 0    Vision:Within last year and Dental: No current dental problems and Receives regular dental care  Past Medical History:  Diagnosis Date   Anxiety    cycles around   Back pain    Depression    Diverticulitis    Hypertension    Irregular menses 11/22/2014   S/P colostomy ([place])    had colostomy with surgery, but has been reversed   Sciatica 11/22/2014   Past Surgical History:  Procedure Laterality Date   BOWEL RESECTION     COLON SURGERY  04/2010   COLONOSCOPY WITH PROPOFOL N/A 11/14/2020   Procedure: COLONOSCOPY WITH PROPOFOL;  Surgeon: Rogene Houston, MD;  [facility];  Service: Endoscopy;  Laterality: N/A;  7:30   Colostomy in April     reversal in April 2013   POLYPECTOMY  11/14/2020   Procedure: POLYPECTOMY;  Surgeon: Rogene Houston, MD;  [facility];  Service: Endoscopy;;  hepatic flexure   Family Status  Relation Name Status   Mother  Alive, age 93y       good health, Parkinson, CAD   Brother  Deceased at age 60       passed from questionable PE   Father  Deceased at age 57       MVC   MGM  Deceased at age 63        MI undx CAD   MGF  Deceased   PGM  Deceased at age 14       old age-dementia   PGF  Deceased    Patient Care Team: Johnette Abraham, MD as PCP - General (Internal Medicine) Troy Sine, MD as PCP - Cardiology (Cardiology) Sinda Du, MD (Internal Medicine)   Outpatient Medications Prior to Visit  Medication Sig   escitalopram (LEXAPRO) 20 MG tablet TAKE ONE (1) TABLET BY MOUTH EVERY DAY   metoprolol succinate (TOPROL-XL) 50 MG 24 hr tablet Take 1.5 tablets (75 mg total) by mouth daily. Take with or immediately following a meal.   [DISCONTINUED] Insulin Pen Needle 31G X 5 MM MISC Use daily with Saxenda   [DISCONTINUED] Liraglutide -Weight Management (SAXENDA) 18 MG/3ML SOPN Inject 0.6 mg daily for 1 week, then increase by 0.44m each week until a max dose of 32mdaily is reached on week 5.   WEGOVY 0.25 MG/0.5ML SOAJ Inject 0.25 mg into the skin once a week for weight loss. (Patient not taking: Reported on 10/24/2021)   No  facility-administered medications prior to visit.   Review of Systems  Constitutional:  Negative for chills and fever.  HENT:  Negative for sore throat.   Respiratory:  Negative for cough and shortness of breath.   Cardiovascular:  Negative for chest pain, palpitations and leg swelling.  Gastrointestinal:  Negative for abdominal pain, blood in stool, constipation, diarrhea, nausea and vomiting.  Genitourinary:  Negative for dysuria and hematuria.  Musculoskeletal:  Negative for myalgias.  Skin:  Negative for itching and rash.  Neurological:  Negative for dizziness and headaches.  Psychiatric/Behavioral:  Negative for depression and suicidal ideas.       Objective:     BP 134/85   Pulse 82   Ht 5' 5"  (1.651 m)   Wt 200 lb 12.8 oz (91.1 kg)   SpO2 97%   BMI 33.41 kg/m  BP Readings from Last 3 Encounters:  10/24/21 134/85  08/30/21 (!) 144/93  07/09/21 116/72    Physical Exam Vitals reviewed.  Constitutional:      General: She is not in  acute distress.    Appearance: Normal appearance. She is obese. She is not toxic-appearing.  HENT:     Head: Normocephalic.     Right Ear: External ear normal.     Left Ear: External ear normal.     Nose: Nose normal. No congestion or rhinorrhea.     Mouth/Throat:     Mouth: Mucous membranes are moist.     Pharynx: Oropharynx is clear. No oropharyngeal exudate.  Eyes:     General: No scleral icterus.    Extraocular Movements: Extraocular movements intact.     Conjunctiva/sclera: Conjunctivae normal.     Pupils: Pupils are equal, round, and reactive to light.  Cardiovascular:     Rate and Rhythm: Normal rate and regular rhythm.     Pulses: Normal pulses.     Heart sounds: Normal heart sounds.  Pulmonary:     Effort: Pulmonary effort is normal.     Breath sounds: Normal breath sounds. No wheezing, rhonchi or rales.  Abdominal:     General: Bowel sounds are normal. There is no distension.     Palpations: Abdomen is soft.     Tenderness: There is no abdominal tenderness.  Musculoskeletal:        General: Normal range of motion.     Cervical back: Normal range of motion.     Right lower leg: No edema.     Left lower leg: No edema.  Lymphadenopathy:     Cervical: No cervical adenopathy.  Skin:    General: Skin is warm and dry.     Capillary Refill: Capillary refill takes less than 2 seconds.     Coloration: Skin is not jaundiced.  Neurological:     General: No focal deficit present.     Mental Status: She is alert and oriented to person, place, and time.     Motor: No weakness.  Psychiatric:        Mood and Affect: Mood normal.        Behavior: Behavior normal.      No results found for any visits on 10/24/21. Last CBC Lab Results  Component Value Date   WBC 9.5 11/09/2020   HGB 14.6 11/09/2020   HCT 44.1 11/09/2020   MCV 95.0 11/09/2020   MCH 31.5 11/09/2020   RDW 12.9 11/09/2020   PLT 257 00/34/9179   Last metabolic panel Lab Results  Component Value Date    GLUCOSE 101 (H)  06/27/2020   NA 140 06/27/2020   K 4.7 06/27/2020   CL 103 06/27/2020   CO2 21 06/27/2020   BUN 16 06/27/2020   CREATININE 0.93 06/27/2020   EGFR 76 06/27/2020   CALCIUM 9.6 06/27/2020   PHOS 3.1 06/15/2010   PROT 6.8 06/27/2020   ALBUMIN 4.5 06/27/2020   LABGLOB 2.3 06/27/2020   AGRATIO 2.0 06/27/2020   BILITOT 0.3 06/27/2020   ALKPHOS 81 06/27/2020   AST 18 06/27/2020   ALT 21 06/27/2020   Last lipids Lab Results  Component Value Date   CHOL 179 06/27/2020   HDL 49 06/27/2020   LDLCALC 110 (H) 06/27/2020   TRIG 111 06/27/2020   CHOLHDL 3.7 06/27/2020   Last hemoglobin A1c Lab Results  Component Value Date   HGBA1C 5.5 06/27/2020   Last thyroid functions Lab Results  Component Value Date   TSH 2.570 06/27/2020   Last vitamin D Lab Results  Component Value Date   VD25OH 22.1 (L) 06/27/2020        Assessment & Plan:    Routine Health Maintenance and Physical Exam  Immunization History  Administered Date(s) Administered   Influenza,inj,Quad PF,6+ Mos 12/25/2019   PFIZER(Purple Top)SARS-COV-2 Vaccination 02/27/2019, 03/21/2019, 12/01/2019    Health Maintenance  Topic Date Due   HIV Screening  Never done   Hepatitis C Screening  Never done   INFLUENZA VACCINE  11/04/2021 (Originally 10/01/2021)   TETANUS/TDAP  10/25/2022 (Originally 03/29/1992)   PAP SMEAR-Modifier  06/28/2023   COLONOSCOPY (Pts 45-38yr Insurance coverage will need to be confirmed)  11/14/2025   HPV VACCINES  Aged Out   COVID-19 Vaccine  Discontinued    Discussed health benefits of physical activity, and encouraged her to engage in regular exercise appropriate for her age and condition.  Problem List Items Addressed This Visit       Endocrine   IFG (impaired fasting glucose)    HgbA1c 5.5 in 2022.  Repeat A1c ordered today.        Other   Heart palpitations    Currently taking metoprolol succinate 75 mg daily.  Denies recent heart palpitations.       Depression    She states that her mood is stable on Lexapro.  She denies SI/HI.  PHQ-2 0.      Tobacco Use    She continues to smoke cigarettes situationally, namely when she drinks alcohol on the weekends.  I again counseled her on smoking cessation.      Encounter for general adult medical examination with abnormal findings    Presenting today for her annual physical exam.  She reports feeling well and denies acute concerns.  Basic lab work ordered today, including one-time screenings for HIV/HCV.  Cancer screenings are up-to-date.  She will receive her influenza vaccine next month at work.      Return in about 1 year (around 10/25/2022).     PJohnette Abraham MD

## 2021-10-24 NOTE — Assessment & Plan Note (Signed)
She states that her mood is stable on Lexapro.  She denies SI/HI.  PHQ-2 0.

## 2021-10-24 NOTE — Assessment & Plan Note (Signed)
HgbA1c 5.5 in 2022.  Repeat A1c ordered today.

## 2021-10-25 ENCOUNTER — Other Ambulatory Visit (HOSPITAL_COMMUNITY): Payer: Self-pay | Admitting: Women's Health

## 2021-10-25 DIAGNOSIS — Z1231 Encounter for screening mammogram for malignant neoplasm of breast: Secondary | ICD-10-CM

## 2021-10-25 LAB — LIPID PANEL

## 2021-10-26 LAB — CBC WITH DIFFERENTIAL/PLATELET
Basophils Absolute: 0.1 10*3/uL (ref 0.0–0.2)
Basos: 1 %
EOS (ABSOLUTE): 0.2 10*3/uL (ref 0.0–0.4)
Eos: 2 %
Hematocrit: 46.2 % (ref 34.0–46.6)
Hemoglobin: 15.6 g/dL (ref 11.1–15.9)
Immature Grans (Abs): 0 10*3/uL (ref 0.0–0.1)
Immature Granulocytes: 0 %
Lymphocytes Absolute: 2.5 10*3/uL (ref 0.7–3.1)
Lymphs: 31 %
MCH: 31.7 pg (ref 26.6–33.0)
MCHC: 33.8 g/dL (ref 31.5–35.7)
MCV: 94 fL (ref 79–97)
Monocytes Absolute: 0.7 10*3/uL (ref 0.1–0.9)
Monocytes: 8 %
Neutrophils Absolute: 4.5 10*3/uL (ref 1.4–7.0)
Neutrophils: 58 %
Platelets: 270 10*3/uL (ref 150–450)
RBC: 4.92 x10E6/uL (ref 3.77–5.28)
RDW: 12.8 % (ref 11.7–15.4)
WBC: 7.9 10*3/uL (ref 3.4–10.8)

## 2021-10-26 LAB — TSH+FREE T4
Free T4: 1.24 ng/dL (ref 0.82–1.77)
TSH: 1.95 u[IU]/mL (ref 0.450–4.500)

## 2021-10-26 LAB — CMP14+EGFR
ALT: 16 IU/L (ref 0–32)
AST: 16 IU/L (ref 0–40)
Albumin/Globulin Ratio: 1.5 (ref 1.2–2.2)
Albumin: 4.5 g/dL (ref 3.9–4.9)
Alkaline Phosphatase: 94 IU/L (ref 44–121)
BUN/Creatinine Ratio: 15 (ref 9–23)
BUN: 13 mg/dL (ref 6–24)
Bilirubin Total: 0.3 mg/dL (ref 0.0–1.2)
CO2: 19 mmol/L — ABNORMAL LOW (ref 20–29)
Calcium: 10 mg/dL (ref 8.7–10.2)
Chloride: 104 mmol/L (ref 96–106)
Creatinine, Ser: 0.87 mg/dL (ref 0.57–1.00)
Globulin, Total: 3 g/dL (ref 1.5–4.5)
Glucose: 86 mg/dL (ref 70–99)
Potassium: 4.8 mmol/L (ref 3.5–5.2)
Sodium: 142 mmol/L (ref 134–144)
Total Protein: 7.5 g/dL (ref 6.0–8.5)
eGFR: 82 mL/min/{1.73_m2} (ref >59)

## 2021-10-26 LAB — B12 AND FOLATE PANEL
Folate: 7.6 ng/mL (ref >3.0)
Vitamin B-12: 172 pg/mL — ABNORMAL LOW (ref 232–1245)

## 2021-10-26 LAB — LIPID PANEL
Chol/HDL Ratio: 3.7 ratio (ref 0.0–4.4)
Cholesterol, Total: 191 mg/dL (ref 100–199)
HDL: 51 mg/dL (ref >39)
LDL Chol Calc (NIH): 122 mg/dL — ABNORMAL HIGH (ref 0–99)
Triglycerides: 98 mg/dL (ref 0–149)
VLDL Cholesterol Cal: 18 mg/dL (ref 5–40)

## 2021-10-26 LAB — HCV INTERPRETATION

## 2021-10-26 LAB — HIV ANTIBODY (ROUTINE TESTING W REFLEX): HIV Screen 4th Generation wRfx: NONREACTIVE

## 2021-10-26 LAB — HCV AB W REFLEX TO QUANT PCR: HCV Ab: NONREACTIVE

## 2021-10-26 LAB — VITAMIN D 25 HYDROXY (VIT D DEFICIENCY, FRACTURES): Vit D, 25-Hydroxy: 35.3 ng/mL (ref 30.0–100.0)

## 2021-10-28 ENCOUNTER — Ambulatory Visit (HOSPITAL_COMMUNITY)
Admission: RE | Admit: 2021-10-28 | Discharge: 2021-10-28 | Disposition: A | Discharge status: Home / Self Care | Payer: No Typology Code available for payment source | Source: Ambulatory Visit | Attending: Women's Health | Admitting: Women's Health

## 2021-10-28 DIAGNOSIS — Z1231 Encounter for screening mammogram for malignant neoplasm of breast: Secondary | ICD-10-CM | POA: Insufficient documentation

## 2021-10-30 ENCOUNTER — Encounter: Payer: No Typology Code available for payment source | Admitting: Family Medicine

## 2021-10-31 LAB — HEMOGLOBIN A1C
Est. average glucose Bld gHb Est-mCnc: 105 mg/dL
Hgb A1c MFr Bld: 5.3 % (ref 4.8–5.6)

## 2021-10-31 LAB — SPECIMEN STATUS REPORT

## 2021-11-15 ENCOUNTER — Encounter: Payer: No Typology Code available for payment source | Admitting: Family Medicine

## 2021-11-22 ENCOUNTER — Other Ambulatory Visit (HOSPITAL_COMMUNITY): Payer: Self-pay

## 2021-11-22 MED ORDER — WEGOVY 0.5 MG/0.5ML ~~LOC~~ SOAJ
0.5000 mg | SUBCUTANEOUS | 0 refills | Status: DC
Start: 2021-11-22 — End: 2022-10-27
  Filled 2021-11-22 – 2021-11-28 (×3): qty 2, 28d supply, fill #0

## 2021-11-25 ENCOUNTER — Other Ambulatory Visit (HOSPITAL_COMMUNITY): Payer: Self-pay

## 2021-11-26 ENCOUNTER — Other Ambulatory Visit (HOSPITAL_COMMUNITY): Payer: Self-pay

## 2021-11-28 ENCOUNTER — Other Ambulatory Visit (HOSPITAL_COMMUNITY): Payer: Self-pay

## 2021-12-17 ENCOUNTER — Other Ambulatory Visit (HOSPITAL_COMMUNITY): Payer: Self-pay

## 2021-12-24 ENCOUNTER — Other Ambulatory Visit (HOSPITAL_COMMUNITY): Payer: Self-pay

## 2021-12-24 MED ORDER — WEGOVY 1 MG/0.5ML ~~LOC~~ SOAJ
1.0000 mg | SUBCUTANEOUS | 0 refills | Status: DC
  Filled 2021-12-24 – 2022-01-14 (×2): qty 2, 28d supply, fill #0

## 2022-01-02 ENCOUNTER — Other Ambulatory Visit (HOSPITAL_COMMUNITY): Payer: Self-pay

## 2022-01-10 ENCOUNTER — Other Ambulatory Visit (HOSPITAL_COMMUNITY): Payer: Self-pay

## 2022-01-14 ENCOUNTER — Other Ambulatory Visit (HOSPITAL_COMMUNITY): Payer: Self-pay

## 2022-01-15 ENCOUNTER — Other Ambulatory Visit (HOSPITAL_COMMUNITY): Payer: Self-pay

## 2022-02-14 ENCOUNTER — Other Ambulatory Visit (HOSPITAL_COMMUNITY): Payer: Self-pay

## 2022-02-14 MED ORDER — WEGOVY 1.7 MG/0.75ML ~~LOC~~ SOAJ
1.7000 mg | SUBCUTANEOUS | 0 refills | Status: DC
  Filled 2022-02-14: qty 3, 28d supply, fill #0

## 2022-03-17 ENCOUNTER — Other Ambulatory Visit (HOSPITAL_COMMUNITY): Payer: Self-pay

## 2022-03-17 MED ORDER — WEGOVY 2.4 MG/0.75ML ~~LOC~~ SOAJ
2.4000 mg | SUBCUTANEOUS | 0 refills | Status: DC
  Filled 2022-03-17 – 2022-04-24 (×2): qty 3, 28d supply, fill #0

## 2022-04-03 ENCOUNTER — Other Ambulatory Visit (HOSPITAL_COMMUNITY): Payer: Self-pay

## 2022-04-24 ENCOUNTER — Other Ambulatory Visit (HOSPITAL_COMMUNITY): Payer: Self-pay

## 2022-05-22 DIAGNOSIS — D2262 Melanocytic nevi of left upper limb, including shoulder: Secondary | ICD-10-CM | POA: Diagnosis not present

## 2022-05-26 ENCOUNTER — Other Ambulatory Visit (HOSPITAL_COMMUNITY): Payer: Self-pay

## 2022-05-26 MED ORDER — WEGOVY 2.4 MG/0.75ML ~~LOC~~ SOAJ
2.4000 mg | SUBCUTANEOUS | 0 refills | Status: DC
  Filled 2022-05-26: qty 3, 28d supply, fill #0

## 2022-05-29 ENCOUNTER — Other Ambulatory Visit (HOSPITAL_COMMUNITY): Payer: Self-pay

## 2022-05-29 MED ORDER — WEGOVY 2.4 MG/0.75ML ~~LOC~~ SOAJ
2.4000 mg | SUBCUTANEOUS | 0 refills | Status: DC
  Filled 2022-05-29: qty 3, 28d supply, fill #0

## 2022-05-30 ENCOUNTER — Other Ambulatory Visit (HOSPITAL_COMMUNITY): Payer: Self-pay

## 2022-06-03 ENCOUNTER — Other Ambulatory Visit (HOSPITAL_COMMUNITY): Payer: Self-pay

## 2022-06-05 ENCOUNTER — Other Ambulatory Visit (HOSPITAL_COMMUNITY): Payer: Self-pay

## 2022-06-23 ENCOUNTER — Encounter: Payer: Self-pay | Admitting: Internal Medicine

## 2022-06-24 ENCOUNTER — Ambulatory Visit: Payer: 59 | Admitting: Internal Medicine

## 2022-06-26 ENCOUNTER — Other Ambulatory Visit (HOSPITAL_COMMUNITY): Payer: Self-pay

## 2022-06-26 MED ORDER — WEGOVY 2.4 MG/0.75ML ~~LOC~~ SOAJ
2.4000 mg | SUBCUTANEOUS | 0 refills | Status: DC
  Filled 2022-06-26 – 2022-06-30 (×2): qty 3, 28d supply, fill #0

## 2022-06-30 ENCOUNTER — Other Ambulatory Visit: Payer: Self-pay

## 2022-07-11 ENCOUNTER — Other Ambulatory Visit: Payer: Self-pay | Admitting: Internal Medicine

## 2022-07-11 DIAGNOSIS — F418 Other specified anxiety disorders: Secondary | ICD-10-CM

## 2022-09-19 ENCOUNTER — Other Ambulatory Visit: Payer: Self-pay | Admitting: Cardiovascular Disease

## 2022-10-27 ENCOUNTER — Ambulatory Visit (INDEPENDENT_AMBULATORY_CARE_PROVIDER_SITE_OTHER): Payer: 59 | Admitting: Internal Medicine

## 2022-10-27 ENCOUNTER — Encounter: Payer: Self-pay | Admitting: Internal Medicine

## 2022-10-27 VITALS — BP 147/99 | HR 72 | Ht 65.0 in | Wt 208.0 lb

## 2022-10-27 DIAGNOSIS — Z23 Encounter for immunization: Secondary | ICD-10-CM

## 2022-10-27 DIAGNOSIS — F418 Other specified anxiety disorders: Secondary | ICD-10-CM | POA: Diagnosis not present

## 2022-10-27 DIAGNOSIS — E7841 Elevated Lipoprotein(a): Secondary | ICD-10-CM | POA: Diagnosis not present

## 2022-10-27 DIAGNOSIS — E785 Hyperlipidemia, unspecified: Secondary | ICD-10-CM | POA: Insufficient documentation

## 2022-10-27 DIAGNOSIS — I10 Essential (primary) hypertension: Secondary | ICD-10-CM | POA: Insufficient documentation

## 2022-10-27 DIAGNOSIS — Z0000 Encounter for general adult medical examination without abnormal findings: Secondary | ICD-10-CM | POA: Insufficient documentation

## 2022-10-27 DIAGNOSIS — E669 Obesity, unspecified: Secondary | ICD-10-CM | POA: Insufficient documentation

## 2022-10-27 DIAGNOSIS — Z0001 Encounter for general adult medical examination with abnormal findings: Secondary | ICD-10-CM | POA: Diagnosis not present

## 2022-10-27 DIAGNOSIS — R002 Palpitations: Secondary | ICD-10-CM | POA: Diagnosis not present

## 2022-10-27 DIAGNOSIS — E538 Deficiency of other specified B group vitamins: Secondary | ICD-10-CM | POA: Insufficient documentation

## 2022-10-27 MED ORDER — ESCITALOPRAM OXALATE 20 MG PO TABS
ORAL_TABLET | ORAL | 2 refills | Status: DC
Start: 2022-10-27 — End: 2023-10-07

## 2022-10-27 MED ORDER — METOPROLOL SUCCINATE ER 100 MG PO TB24
100.0000 mg | ORAL_TABLET | Freq: Every day | ORAL | 3 refills | Status: DC
Start: 2022-10-27 — End: 2023-09-15

## 2022-10-27 NOTE — Assessment & Plan Note (Signed)
LDL 122 on labs from August 2023.  Lifestyle modifications aimed at lowering her cholesterol were reviewed.  Mediterranean diet recommended. -Repeat lipid panel ordered today

## 2022-10-27 NOTE — Assessment & Plan Note (Signed)
She endorses a history of essential hypertension.  BP is elevated today, 151/93 initially and 147/99 on repeat.  She is currently taking metoprolol succinate 50 mg daily but states that she has previously been prescribed 100 mg daily.  This is also for heart palpitations.  She would like to resume Toprol-XL 100 mg daily. -New prescription for metoprolol succinate 100 mg daily sent today

## 2022-10-27 NOTE — Assessment & Plan Note (Signed)
She has previously been treated with [place] and Wegovy.  Wegovy discontinued due to loss of insurance coverage.  She is focused on lifestyle modifications aimed at weight loss and is interested in resuming Wegovy when covered by her insurance.

## 2022-10-27 NOTE — Assessment & Plan Note (Signed)
Annual exam completed today.  Previous records and labs reviewed. -Repeat labs ordered today -Influenza vaccine administered today -We will tentatively plan for follow-up in 1 year

## 2022-10-27 NOTE — Progress Notes (Signed)
Complete physical exam  Patient: Cheryl Ballard   DOB: [birth year]   47 y.o. Female  MRN: [id number]  Subjective:    Chief Complaint  Patient presents with   Annual Exam    Cheryl Ballard is a 47 y.o. female who presents today for a complete physical exam. She reports consuming a general diet. The patient does not participate in regular exercise at present. She generally feels fairly well. She reports sleeping well. She does not have additional problems to discuss today.    Most recent fall risk assessment:    10/27/2022    9:41 AM  Fall Risk   Falls in the past year? 0  Number falls in past yr: 0  Injury with Fall? 0  Risk for fall due to : No Fall Risks  Follow up Falls evaluation completed     Most recent depression screenings:    10/27/2022    9:41 AM 10/24/2021   10:10 AM  PHQ 2/9 Scores  PHQ - 2 Score 1 0  PHQ- 9 Score 3     Vision:Within last year and Dental: No current dental problems and Receives regular dental care  Past Medical History:  Diagnosis Date   Anxiety    cycles around   Back pain    Depression    Diverticulitis    Hypertension    Irregular menses 11/22/2014   S/P colostomy (HCC)    had colostomy with surgery, but has been reversed   Sciatica 11/22/2014   Past Surgical History:  Procedure Laterality Date   BOWEL RESECTION     COLON SURGERY  04/2010   COLONOSCOPY WITH PROPOFOL N/A 11/14/2020   Procedure: COLONOSCOPY WITH PROPOFOL;  Surgeon: Malissa Hippo, MD;  [facility];  Service: Endoscopy;  Laterality: N/A;  7:30   Colostomy in April     reversal in April 2013   POLYPECTOMY  11/14/2020   Procedure: POLYPECTOMY;  Surgeon: Malissa Hippo, MD;  Location: [facility];  Service: Endoscopy;;  hepatic flexure   Social History   Tobacco Use   Smoking status: Some Days    Current packs/day: 0.00    Types: Cigarettes   Smokeless tobacco: Never   Tobacco comments:    Patient states that she is trying to quit.  Vaping Use    Vaping status: Never Used  Substance Use Topics   Alcohol use: Yes    Alcohol/week: 1.0 standard drink of alcohol    Types: 1 Cans of beer per week    Comment: occasional   Drug use: No   Family History  Problem Relation Age of Onset   Heart disease Mother        afib   Parkinson's disease Mother    Arthritis Mother    Early death Brother    Heart disease Brother    Heart disease Maternal Grandmother        MI-CAD   No Known Allergies    Patient Care Team: Billie Lade, MD as PCP - General (Internal Medicine) Lennette Bihari, MD as PCP - Cardiology (Cardiology) Kari Baars, MD (Internal Medicine)   Outpatient Medications Prior to Visit  Medication Sig   [DISCONTINUED] escitalopram (LEXAPRO) 20 MG tablet Take 1 tablet by mouth every day.   [DISCONTINUED] metoprolol succinate (TOPROL-XL) 50 MG 24 hr tablet Take 1 tablet (50 mg total) by mouth daily. PT. MUST MAKE AN APPOINTMENT IN ORDER TO RECEIVE FUTURE REFILLS. FIST ATTEMPT.   [DISCONTINUED] WEGOVY 0.25 MG/0.5ML  SOAJ Inject 0.25 mg into the skin once a week for weight loss. (Patient not taking: Reported on 10/24/2021)   [DISCONTINUED] WEGOVY 0.5 MG/0.5ML SOAJ Inject 0.5 mg into the skin once a week for weight loss   [DISCONTINUED] WEGOVY 1 MG/0.5ML SOAJ Inject 1 mg into the skin once a week for weight loss.   [DISCONTINUED] WEGOVY 1.7 MG/0.75ML SOAJ Inject 1.7 mg into the skin once a week for weight loss   [DISCONTINUED] WEGOVY 2.4 MG/0.75ML SOAJ Inject 2.4 mg into the skin once a week.   [DISCONTINUED] WEGOVY 2.4 MG/0.75ML SOAJ Inject 2.4 mg into the skin once a week.   [DISCONTINUED] WEGOVY 2.4 MG/0.75ML SOAJ Inject 2.4 mg into the skin once a week.   No facility-administered medications prior to visit.   Review of Systems  Constitutional:  Negative for chills and fever.  HENT:  Negative for sore throat.   Respiratory:  Negative for cough and shortness of breath.   Cardiovascular:  Negative for chest pain,  palpitations and leg swelling.  Gastrointestinal:  Negative for abdominal pain, blood in stool, constipation, diarrhea, nausea and vomiting.  Genitourinary:  Negative for dysuria and hematuria.  Musculoskeletal:  Negative for myalgias.  Skin:  Negative for itching and rash.  Neurological:  Negative for dizziness and headaches.  Psychiatric/Behavioral:  Negative for depression and suicidal ideas.       Objective:     BP (!) 147/99   Pulse 72   Ht 5\' 5"  (1.651 m)   Wt 208 lb (94.3 kg)   SpO2 97%   BMI 34.61 kg/m  BP Readings from Last 3 Encounters:  10/27/22 (!) 147/99  10/24/21 134/85  08/30/21 (!) 144/93   Physical Exam Vitals reviewed.  Constitutional:      General: She is not in acute distress.    Appearance: Normal appearance. She is obese. She is not toxic-appearing.  HENT:     Head: Normocephalic and atraumatic.     Right Ear: External ear normal.     Left Ear: External ear normal.     Nose: Nose normal. No congestion or rhinorrhea.     Mouth/Throat:     Mouth: Mucous membranes are moist.     Pharynx: Oropharynx is clear. No oropharyngeal exudate or posterior oropharyngeal erythema.  Eyes:     General: No scleral icterus.    Extraocular Movements: Extraocular movements intact.     Conjunctiva/sclera: Conjunctivae normal.     Pupils: Pupils are equal, round, and reactive to light.  Cardiovascular:     Rate and Rhythm: Normal rate and regular rhythm.     Pulses: Normal pulses.     Heart sounds: Normal heart sounds. No murmur heard.    No friction rub. No gallop.  Pulmonary:     Effort: Pulmonary effort is normal.     Breath sounds: Normal breath sounds. No wheezing, rhonchi or rales.  Abdominal:     General: Abdomen is flat. Bowel sounds are normal. There is no distension.     Palpations: Abdomen is soft.     Tenderness: There is no abdominal tenderness.  Musculoskeletal:        General: No swelling. Normal range of motion.     Cervical back: Normal range of  motion.     Right lower leg: No edema.     Left lower leg: No edema.  Lymphadenopathy:     Cervical: No cervical adenopathy.  Skin:    General: Skin is warm and dry.  Capillary Refill: Capillary refill takes less than 2 seconds.     Coloration: Skin is not jaundiced.  Neurological:     General: No focal deficit present.     Mental Status: She is alert and oriented to person, place, and time.  Psychiatric:        Mood and Affect: Mood normal.        Behavior: Behavior normal.   Last CBC Lab Results  Component Value Date   WBC 7.9 10/24/2021   HGB 15.6 10/24/2021   HCT 46.2 10/24/2021   MCV 94 10/24/2021   MCH 31.7 10/24/2021   RDW 12.8 10/24/2021   PLT 270 10/24/2021   Last metabolic panel Lab Results  Component Value Date   GLUCOSE 86 10/24/2021   NA 142 10/24/2021   K 4.8 10/24/2021   CL 104 10/24/2021   CO2 19 (L) 10/24/2021   BUN 13 10/24/2021   CREATININE 0.87 10/24/2021   EGFR 82 10/24/2021   CALCIUM 10.0 10/24/2021   PHOS 3.1 06/15/2010   PROT 7.5 10/24/2021   ALBUMIN 4.5 10/24/2021   LABGLOB 3.0 10/24/2021   AGRATIO 1.5 10/24/2021   BILITOT 0.3 10/24/2021   ALKPHOS 94 10/24/2021   AST 16 10/24/2021   ALT 16 10/24/2021   Last lipids Lab Results  Component Value Date   CHOL 191 10/24/2021   HDL 51 10/24/2021   LDLCALC 122 (H) 10/24/2021   TRIG 98 10/24/2021   CHOLHDL 3.7 10/24/2021   Last hemoglobin A1c Lab Results  Component Value Date   HGBA1C 5.3 10/24/2021   Last thyroid functions Lab Results  Component Value Date   TSH 1.950 10/24/2021   Last vitamin D Lab Results  Component Value Date   VD25OH 35.3 10/24/2021   Last vitamin B12 and Folate Lab Results  Component Value Date   VITAMINB12 172 (L) 10/24/2021   FOLATE 7.6 10/24/2021        Assessment & Plan:    Routine Health Maintenance and Physical Exam  Immunization History  Administered Date(s) Administered   Influenza, Seasonal, Injecte, Preservative Fre 10/27/2022    Influenza,inj,Quad PF,6+ Mos 12/25/2019   PFIZER(Purple Top)SARS-COV-2 Vaccination 02/27/2019, 03/21/2019, 12/01/2019    Health Maintenance  Topic Date Due   DTaP/Tdap/Td (1 - Tdap) Never done   PAP SMEAR-Modifier  06/28/2023   Colonoscopy  11/14/2025   INFLUENZA VACCINE  Completed   Hepatitis C Screening  Completed   HIV Screening  Completed   HPV VACCINES  Aged Out   COVID-19 Vaccine  Discontinued    Discussed health benefits of physical activity, and encouraged her to engage in regular exercise appropriate for her age and condition.  Problem List Items Addressed This Visit       Essential hypertension    She endorses a history of essential hypertension.  BP is elevated today, 151/93 initially and 147/99 on repeat.  She is currently taking metoprolol succinate 50 mg daily but states that she has previously been prescribed 100 mg daily.  This is also for heart palpitations.  She would like to resume Toprol-XL 100 mg daily. -New prescription for metoprolol succinate 100 mg daily sent today      Vitamin B12 deficiency    Noted on previous labs.  Not currently on supplementation.  Repeat vitamin B12 level ordered today.      Hyperlipidemia    LDL 122 on labs from August 2023.  Lifestyle modifications aimed at lowering her cholesterol were reviewed.  Mediterranean diet recommended. -Repeat lipid panel  ordered today      Obesity (BMI 30-39.9)    She has previously been treated with [place] and Wegovy.  Wegovy discontinued due to loss of insurance coverage.  She is focused on lifestyle modifications aimed at weight loss and is interested in resuming Wegovy when covered by her insurance.      Encounter for well adult exam with abnormal findings    Annual exam completed today.  Previous records and labs reviewed. -Repeat labs ordered today -Influenza vaccine administered today -We will tentatively plan for follow-up in 1 year      Need for influenza vaccination    Influenza  vaccine administered today      Return in about 1 year (around 10/27/2023) for CPE.  Billie Lade, MD

## 2022-10-27 NOTE — Assessment & Plan Note (Signed)
Noted on previous labs.  Not currently on supplementation.  Repeat vitamin B12 level ordered today.

## 2022-10-27 NOTE — Assessment & Plan Note (Signed)
Influenza vaccine administered today.

## 2022-10-27 NOTE — Patient Instructions (Signed)
It was a pleasure to see you today.  Thank you for giving [place] the opportunity to be involved in your care.  Below is a brief recap of your visit and next steps.  We will plan to see you again in 1 year.  Summary Annual exam completed today Repeat labs ordered Follow up in 1 year for annual exam

## 2022-10-28 LAB — CBC WITH DIFFERENTIAL/PLATELET
Basophils Absolute: 0.1 10*3/uL (ref 0.0–0.2)
Basos: 1 %
EOS (ABSOLUTE): 0.2 10*3/uL (ref 0.0–0.4)
Eos: 2 %
Hematocrit: 44.3 % (ref 34.0–46.6)
Hemoglobin: 15.1 g/dL (ref 11.1–15.9)
Immature Grans (Abs): 0 10*3/uL (ref 0.0–0.1)
Immature Granulocytes: 0 %
Lymphocytes Absolute: 2.6 10*3/uL (ref 0.7–3.1)
Lymphs: 31 %
MCH: 31.3 pg (ref 26.6–33.0)
MCHC: 34.1 g/dL (ref 31.5–35.7)
MCV: 92 fL (ref 79–97)
Monocytes Absolute: 0.8 10*3/uL (ref 0.1–0.9)
Monocytes: 10 %
Neutrophils Absolute: 4.7 10*3/uL (ref 1.4–7.0)
Neutrophils: 56 %
Platelets: 285 10*3/uL (ref 150–450)
RBC: 4.82 x10E6/uL (ref 3.77–5.28)
RDW: 11.7 % (ref 11.7–15.4)
WBC: 8.3 10*3/uL (ref 3.4–10.8)

## 2022-10-28 LAB — CMP14+EGFR
ALT: 22 IU/L (ref 0–32)
AST: 17 IU/L (ref 0–40)
Albumin: 4.2 g/dL (ref 3.9–4.9)
Alkaline Phosphatase: 84 IU/L (ref 44–121)
BUN/Creatinine Ratio: 17 (ref 9–23)
BUN: 17 mg/dL (ref 6–24)
Bilirubin Total: 0.4 mg/dL (ref 0.0–1.2)
CO2: 22 mmol/L (ref 20–29)
Calcium: 9.6 mg/dL (ref 8.7–10.2)
Chloride: 105 mmol/L (ref 96–106)
Creatinine, Ser: 0.98 mg/dL (ref 0.57–1.00)
Globulin, Total: 2.7 g/dL (ref 1.5–4.5)
Glucose: 98 mg/dL (ref 70–99)
Potassium: 4.5 mmol/L (ref 3.5–5.2)
Sodium: 141 mmol/L (ref 134–144)
Total Protein: 6.9 g/dL (ref 6.0–8.5)
eGFR: 71 mL/min/{1.73_m2} (ref >59)

## 2022-10-28 LAB — VITAMIN D 25 HYDROXY (VIT D DEFICIENCY, FRACTURES): Vit D, 25-Hydroxy: 24.6 ng/mL — ABNORMAL LOW (ref 30.0–100.0)

## 2022-10-28 LAB — B12 AND FOLATE PANEL
Folate: 8.5 ng/mL (ref >3.0)
Vitamin B-12: 205 pg/mL — ABNORMAL LOW (ref 232–1245)

## 2022-10-28 LAB — TSH+FREE T4
Free T4: 1.11 ng/dL (ref 0.82–1.77)
TSH: 2.53 u[IU]/mL (ref 0.450–4.500)

## 2022-10-28 LAB — HEMOGLOBIN A1C
Est. average glucose Bld gHb Est-mCnc: 111 mg/dL
Hgb A1c MFr Bld: 5.5 % (ref 4.8–5.6)

## 2022-10-28 LAB — LIPID PANEL
Chol/HDL Ratio: 3.3 ratio (ref 0.0–4.4)
Cholesterol, Total: 179 mg/dL (ref 100–199)
HDL: 54 mg/dL (ref >39)
LDL Chol Calc (NIH): 97 mg/dL (ref 0–99)
Triglycerides: 160 mg/dL — ABNORMAL HIGH (ref 0–149)
VLDL Cholesterol Cal: 28 mg/dL (ref 5–40)

## 2022-12-26 ENCOUNTER — Other Ambulatory Visit (HOSPITAL_COMMUNITY): Payer: Self-pay | Admitting: Women's Health

## 2022-12-26 DIAGNOSIS — Z1231 Encounter for screening mammogram for malignant neoplasm of breast: Secondary | ICD-10-CM

## 2022-12-31 ENCOUNTER — Inpatient Hospital Stay (HOSPITAL_COMMUNITY): Admission: RE | Admit: 2022-12-31 | Payer: 59 | Source: Ambulatory Visit

## 2022-12-31 DIAGNOSIS — Z1231 Encounter for screening mammogram for malignant neoplasm of breast: Secondary | ICD-10-CM

## 2023-01-08 ENCOUNTER — Ambulatory Visit (HOSPITAL_COMMUNITY)
Admission: RE | Admit: 2023-01-08 | Discharge: 2023-01-08 | Disposition: A | Discharge status: Home / Self Care | Payer: 59 | Source: Ambulatory Visit | Attending: Women's Health | Admitting: Women's Health

## 2023-01-08 DIAGNOSIS — Z1231 Encounter for screening mammogram for malignant neoplasm of breast: Secondary | ICD-10-CM | POA: Diagnosis not present

## 2023-01-13 ENCOUNTER — Other Ambulatory Visit (HOSPITAL_COMMUNITY): Payer: Self-pay | Admitting: Women's Health

## 2023-01-13 DIAGNOSIS — R928 Other abnormal and inconclusive findings on diagnostic imaging of breast: Secondary | ICD-10-CM

## 2023-02-19 ENCOUNTER — Ambulatory Visit (HOSPITAL_COMMUNITY): Payer: 59

## 2023-02-19 ENCOUNTER — Encounter (HOSPITAL_COMMUNITY): Payer: 59

## 2023-04-14 ENCOUNTER — Ambulatory Visit (HOSPITAL_COMMUNITY)
Admission: RE | Admit: 2023-04-14 | Discharge: 2023-04-14 | Disposition: A | Discharge status: Home / Self Care | Payer: Commercial Managed Care - PPO | Source: Ambulatory Visit | Attending: Women's Health | Admitting: Women's Health

## 2023-04-14 ENCOUNTER — Encounter (HOSPITAL_COMMUNITY): Payer: Self-pay

## 2023-04-14 DIAGNOSIS — R928 Other abnormal and inconclusive findings on diagnostic imaging of breast: Secondary | ICD-10-CM | POA: Insufficient documentation

## 2023-04-14 DIAGNOSIS — R92333 Mammographic heterogeneous density, bilateral breasts: Secondary | ICD-10-CM | POA: Diagnosis not present

## 2023-05-21 ENCOUNTER — Encounter: Payer: Self-pay | Admitting: Physician Assistant

## 2023-06-23 ENCOUNTER — Telehealth: Payer: Self-pay | Admitting: Physician Assistant

## 2023-06-23 NOTE — Telephone Encounter (Signed)
 Deleting recall- unable to contact pt to sch after multiple attempts

## 2023-08-27 ENCOUNTER — Other Ambulatory Visit: Payer: Self-pay | Admitting: Advanced Practice Midwife

## 2023-08-27 MED ORDER — ONDANSETRON 4 MG PO TBDP
4.0000 mg | ORAL_TABLET | Freq: Four times a day (QID) | ORAL | 2 refills | Status: DC | PRN
  Filled 2023-08-27: qty 30, 8d supply, fill #0

## 2023-08-27 MED ORDER — AMOXICILLIN 500 MG PO CAPS
500.0000 mg | ORAL_CAPSULE | Freq: Three times a day (TID) | ORAL | 0 refills | Status: DC
  Filled 2023-08-27: qty 21, 7d supply, fill #0

## 2023-08-27 NOTE — Progress Notes (Signed)
 Going on a cruise, zofran and amoxicillin  sent to pharmacy for prn use

## 2023-08-28 ENCOUNTER — Other Ambulatory Visit: Payer: Self-pay | Admitting: Advanced Practice Midwife

## 2023-08-28 ENCOUNTER — Other Ambulatory Visit (HOSPITAL_COMMUNITY): Payer: Self-pay

## 2023-08-28 MED ORDER — AMOXICILLIN 500 MG PO CAPS: 500.0000 mg | ORAL_CAPSULE | Freq: Three times a day (TID) | ORAL | 0 refills | Status: AC

## 2023-08-28 MED ORDER — ONDANSETRON 4 MG PO TBDP
4.0000 mg | ORAL_TABLET | Freq: Four times a day (QID) | ORAL | 2 refills | Status: AC | PRN
Start: 2023-08-28 — End: ?

## 2023-08-28 NOTE — Progress Notes (Signed)
Sent to [facility]

## 2023-08-30 ENCOUNTER — Other Ambulatory Visit: Payer: Self-pay | Admitting: Cardiovascular Disease

## 2023-09-06 ENCOUNTER — Other Ambulatory Visit: Payer: Self-pay | Admitting: Internal Medicine

## 2023-09-06 DIAGNOSIS — I10 Essential (primary) hypertension: Secondary | ICD-10-CM

## 2023-09-06 DIAGNOSIS — R002 Palpitations: Secondary | ICD-10-CM

## 2023-09-15 ENCOUNTER — Other Ambulatory Visit: Payer: Self-pay

## 2023-09-15 ENCOUNTER — Telehealth: Payer: Self-pay

## 2023-09-15 DIAGNOSIS — R002 Palpitations: Secondary | ICD-10-CM

## 2023-09-15 DIAGNOSIS — I10 Essential (primary) hypertension: Secondary | ICD-10-CM

## 2023-09-15 MED ORDER — METOPROLOL SUCCINATE ER 100 MG PO TB24: 100.0000 mg | ORAL_TABLET | Freq: Every day | ORAL | 0 refills | Status: DC

## 2023-09-15 NOTE — Telephone Encounter (Signed)
 Should be able to refill. Lvm to see what pharmacy she wants

## 2023-09-15 NOTE — Telephone Encounter (Signed)
 Copied from CRM [id number]. Topic: General - Other >> Sep 15, 2023 11:15 AM Emylou G wrote: Reason for CRM: Patient was denied: Refused Metoprolol  Succinate 100 MG.. Not sure why she said.SABRA and her phys was canceled stated she wasn't notified?  Her number on file is good.. can we refill?

## 2023-09-15 NOTE — Telephone Encounter (Signed)
 Medication sent to pharmacy

## 2023-10-06 ENCOUNTER — Other Ambulatory Visit: Payer: Self-pay | Admitting: Family Medicine

## 2023-10-07 ENCOUNTER — Other Ambulatory Visit: Payer: Self-pay | Admitting: Internal Medicine

## 2023-10-07 DIAGNOSIS — F418 Other specified anxiety disorders: Secondary | ICD-10-CM

## 2023-10-24 ENCOUNTER — Other Ambulatory Visit: Payer: Self-pay | Admitting: Medical Genetics

## 2023-10-29 ENCOUNTER — Encounter: Payer: 59 | Admitting: Internal Medicine

## 2023-10-30 ENCOUNTER — Other Ambulatory Visit (HOSPITAL_COMMUNITY)

## 2023-10-30 ENCOUNTER — Encounter: Payer: Self-pay | Admitting: Nurse Practitioner

## 2023-10-30 ENCOUNTER — Encounter: Admitting: Family Medicine

## 2023-10-30 ENCOUNTER — Ambulatory Visit: Admitting: Nurse Practitioner

## 2023-10-30 VITALS — BP 132/86 | HR 77 | Ht 65.0 in | Wt 227.0 lb

## 2023-10-30 DIAGNOSIS — R7301 Impaired fasting glucose: Secondary | ICD-10-CM | POA: Diagnosis not present

## 2023-10-30 DIAGNOSIS — R002 Palpitations: Secondary | ICD-10-CM

## 2023-10-30 DIAGNOSIS — I10 Essential (primary) hypertension: Secondary | ICD-10-CM

## 2023-10-30 DIAGNOSIS — E663 Overweight: Secondary | ICD-10-CM

## 2023-10-30 DIAGNOSIS — F418 Other specified anxiety disorders: Secondary | ICD-10-CM | POA: Diagnosis not present

## 2023-10-30 DIAGNOSIS — R5383 Other fatigue: Secondary | ICD-10-CM

## 2023-10-30 DIAGNOSIS — Z0001 Encounter for general adult medical examination with abnormal findings: Secondary | ICD-10-CM | POA: Diagnosis not present

## 2023-10-30 DIAGNOSIS — M79671 Pain in right foot: Secondary | ICD-10-CM

## 2023-10-30 MED ORDER — METOPROLOL SUCCINATE ER 100 MG PO TB24
100.0000 mg | ORAL_TABLET | Freq: Every day | ORAL | 3 refills | Status: AC
Start: 2023-10-30 — End: ?

## 2023-10-30 MED ORDER — ESCITALOPRAM OXALATE 20 MG PO TABS: 20.0000 mg | ORAL_TABLET | Freq: Every day | ORAL | 3 refills | Status: AC

## 2023-10-30 NOTE — Progress Notes (Signed)
 Established Patient Office Visit  Subjective:  Patient ID: Cheryl Ballard, female    DOB: 10-Apr-1975  Age: 48 y.o. MRN: [id number]  Chief Complaint  Patient presents with   Annual Exam    Patient interested in physical today, fasting for labs.  Patient does have concern for right foot, intermittent chronic pain.  Patient reports pain increase after working, wears good supportive shoes.  Discussed icing plantar fasciitis symptoms, patient already does.  Also elevates during sleep.      No other concerns at this time.   Past Medical History:  Diagnosis Date   Anxiety    cycles around   Back pain    Depression    Diverticulitis    Hypertension    Irregular menses 11/22/2014   S/P colostomy [facility])    had colostomy with surgery, but has been reversed   Sciatica 11/22/2014    Past Surgical History:  Procedure Laterality Date   BOWEL RESECTION     COLON SURGERY  04/2010   COLONOSCOPY WITH PROPOFOL  N/A 11/14/2020   Procedure: COLONOSCOPY WITH PROPOFOL ;  Surgeon: Golda Claudis PENNER, MD;  [facility];  Service: Endoscopy;  Laterality: N/A;  7:30   Colostomy in April     reversal in April 2013   POLYPECTOMY  11/14/2020   Procedure: POLYPECTOMY;  Surgeon: Golda Claudis PENNER, MD;  [facility]: [facility];  Service: Endoscopy;;  hepatic flexure    Social History   Socioeconomic History   Marital status: Married    Spouse name: Elsie    Number of children: 0   Years of education: 16   Highest education level: Bachelor's degree (e.g., BA, AB, BS)  Occupational History   Occupation: Engineer, 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  Vaping Use   Vaping status: Never Used  Substance and Sexual Activity   Alcohol use: Yes    Alcohol/week: 1.0 standard drink of  alcohol    Types: 1 Cans of beer per week    Comment: occasional   Drug use: No   Sexual activity: Yes    Birth control/protection: None  Other Topics Concern   Not on file  Social History Narrative   Lives with Elsie, husband of 4 years in May of 2020.   Great Dane named, Diesel who is 50 years old.      Main caregiver for mom-who has parkinson's and DDD, she is 45 years old (2020).      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       Wears seat belt.   Wears sunscreen.   Eats fruits and veggies.   Consumes milk regularly.     Social Drivers of Corporate investment banker Strain: Low Risk  (06/27/2020)   Overall Financial Resource Strain (CARDIA)    Difficulty of Paying Living Expenses: Not hard at all  Food Insecurity: No Food Insecurity (06/27/2020)   Hunger Vital Sign    Worried About Running Out of Food in the Last Year: Never true    Ran Out of Food in the Last Year: Never true  Transportation Needs: No Transportation Needs (06/27/2020)   PRAPARE - Transportation    Lack of Transportation (  Medical): No    Lack of Transportation (Non-Medical): No  Physical Activity: Unknown (06/27/2020)   Exercise Vital Sign    Days of Exercise per Week: 3 days    Minutes of Exercise per Session: Not on file  Stress: No Stress Concern Present (06/27/2020)   [employer] of Occupational Health - Occupational Stress Questionnaire    Feeling of Stress : Only a little  Social Connections: Moderately Integrated (06/27/2020)   Social Connection and Isolation Panel    Frequency of Communication with Friends and Family: More than three times a week    Frequency of Social Gatherings with Friends and Family: More than three times a week    Attends Religious Services: More than 4 times per year    Active Member of [employer] or Organizations: No    Attends Banker Meetings: Never    Marital Status: Married  Catering manager Violence: Not At Risk (06/27/2020)   Humiliation,  Afraid, Rape, and Kick questionnaire    Fear of Current or Ex-Partner: No    Emotionally Abused: No    Physically Abused: No    Sexually Abused: No    Family History  Problem Relation Age of Onset   Heart disease Mother        afib   Parkinson's disease Mother    Arthritis Mother    Early death Brother    Heart disease Brother    Heart disease Maternal Grandmother        MI-CAD    No Known Allergies  Outpatient Medications Prior to Visit  Medication Sig   amoxicillin  (AMOXIL ) 500 MG capsule Take 1 capsule (500 mg total) by mouth 3 (three) times daily.   escitalopram  (LEXAPRO ) 20 MG tablet Take 1 tablet by mouth every day.   metoprolol  succinate (TOPROL -XL) 100 MG 24 hr tablet Take 1 tablet (100 mg total) by mouth daily. Take with or immediately following a meal.   ondansetron  (ZOFRAN -ODT) 4 MG disintegrating tablet Take 1 tablet (4 mg total) by mouth every 6 (six) hours as needed for nausea.   No facility-administered medications prior to visit.    ROS     Objective:   BP 132/86   Pulse 77   Ht 5' 5 (1.651 m)   Wt 227 lb (103 kg)   LMP 11/15/2020 (Approximate)   SpO2 98%   BMI 37.77 kg/m   Vitals:   10/30/23 0854  BP: 132/86  Pulse: 77  Height: 5' 5 (1.651 m)  Weight: 227 lb (103 kg)  SpO2: 98%  BMI (Calculated): 37.77    Physical Exam Vitals and nursing note reviewed.  Constitutional:      Appearance: Normal appearance. She is obese.  HENT:     Head: Normocephalic.     Nose: Nose normal.     Mouth/Throat:     Mouth: Mucous membranes are moist.  Musculoskeletal:        General: Swelling and tenderness present.     Cervical back: Normal range of motion and neck supple.  Skin:    General: Skin is warm and dry.  Neurological:     Mental Status: She is alert and oriented to person, place, and time.  Psychiatric:        Mood and Affect: Mood normal.        Behavior: Behavior normal.      No results found for any visits on 10/30/23.  No  results found for this or any previous visit (from the past 2160  hours).    Assessment & Plan: Physical exam - fasting labs today Right foot pain - icing, compression and elevation Follow up appt in 1 year   Problem List Items Addressed This Visit   None   No follow-ups on file.   Total time spent: 20 minutes  Neale Carpen, NP  10/30/2023   This document may have been prepared by [facility] Voice Recognition software and as such may include unintentional dictation errors.

## 2023-10-30 NOTE — Patient Instructions (Signed)
 1) Physical exam great today 2) Ice, compress, and elevate right foot when plantar faciitis flare 3) Fasting labs today

## 2023-10-31 LAB — CMP14+EGFR
ALT: 25 IU/L (ref 0–32)
AST: 19 IU/L (ref 0–40)
Albumin: 4.4 g/dL (ref 3.9–4.9)
Alkaline Phosphatase: 84 IU/L (ref 44–121)
BUN/Creatinine Ratio: 17 (ref 9–23)
BUN: 15 mg/dL (ref 6–24)
Bilirubin Total: 0.3 mg/dL (ref 0.0–1.2)
CO2: 20 mmol/L (ref 20–29)
Calcium: 9.3 mg/dL (ref 8.7–10.2)
Chloride: 103 mmol/L (ref 96–106)
Creatinine, Ser: 0.88 mg/dL (ref 0.57–1.00)
Globulin, Total: 2.4 g/dL (ref 1.5–4.5)
Glucose: 104 mg/dL — ABNORMAL HIGH (ref 70–99)
Potassium: 4.4 mmol/L (ref 3.5–5.2)
Sodium: 140 mmol/L (ref 134–144)
Total Protein: 6.8 g/dL (ref 6.0–8.5)
eGFR: 80 mL/min/1.73 (ref >59)

## 2023-10-31 LAB — LIPID PANEL
Chol/HDL Ratio: 3.4 ratio (ref 0.0–4.4)
Cholesterol, Total: 171 mg/dL (ref 100–199)
HDL: 51 mg/dL (ref >39)
LDL Chol Calc (NIH): 97 mg/dL (ref 0–99)
Triglycerides: 133 mg/dL (ref 0–149)
VLDL Cholesterol Cal: 23 mg/dL (ref 5–40)

## 2023-10-31 LAB — HEMOGLOBIN A1C
Est. average glucose Bld gHb Est-mCnc: 117 mg/dL
Hgb A1c MFr Bld: 5.7 % — ABNORMAL HIGH (ref 4.8–5.6)

## 2023-10-31 LAB — TSH+FREE T4
Free T4: 1.04 ng/dL (ref 0.82–1.77)
TSH: 2.01 u[IU]/mL (ref 0.450–4.500)

## 2023-11-03 ENCOUNTER — Ambulatory Visit: Payer: Self-pay

## 2023-11-03 ENCOUNTER — Other Ambulatory Visit (HOSPITAL_COMMUNITY)

## 2023-11-06 ENCOUNTER — Other Ambulatory Visit (HOSPITAL_COMMUNITY)

## 2023-12-22 ENCOUNTER — Other Ambulatory Visit: Payer: Self-pay | Admitting: Medical Genetics

## 2023-12-22 DIAGNOSIS — Z006 Encounter for examination for normal comparison and control in clinical research program: Secondary | ICD-10-CM

## 2023-12-25 ENCOUNTER — Ambulatory Visit: Admitting: Adult Health

## 2023-12-28 ENCOUNTER — Other Ambulatory Visit (HOSPITAL_BASED_OUTPATIENT_CLINIC_OR_DEPARTMENT_OTHER): Payer: Self-pay
